# Patient Record
Sex: Female | Born: 1985 | Race: White | Hispanic: No | Marital: Married | Smoking: Never smoker
Health system: Southern US, Community
[De-identification: ages and names within clinical notes are randomized; demographics above are authoritative.]

## PROBLEM LIST (undated history)

## (undated) DIAGNOSIS — Z789 Other specified health status: Secondary | ICD-10-CM

## (undated) DIAGNOSIS — B009 Herpesviral infection, unspecified: Secondary | ICD-10-CM

## (undated) DIAGNOSIS — I493 Ventricular premature depolarization: Secondary | ICD-10-CM

## (undated) HISTORY — PX: TONSILLECTOMY: SUR1361

## (undated) HISTORY — PX: EYE SURGERY: SHX253

## (undated) HISTORY — PX: APPENDECTOMY: SHX54

## (undated) HISTORY — DX: Ventricular premature depolarization: I49.3

---
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
## 2008-06-06 ENCOUNTER — Inpatient Hospital Stay (HOSPITAL_COMMUNITY): Admission: AD | Admit: 2008-06-06 | Discharge: 2008-06-06 | Payer: Self-pay | Admitting: Family Medicine

## 2008-12-07 ENCOUNTER — Ambulatory Visit (HOSPITAL_COMMUNITY): Admission: EM | Admit: 2008-12-07 | Discharge: 2008-12-08 | Payer: Self-pay | Admitting: Emergency Medicine

## 2008-12-08 ENCOUNTER — Encounter (INDEPENDENT_AMBULATORY_CARE_PROVIDER_SITE_OTHER): Payer: Self-pay | Admitting: General Surgery

## 2009-08-11 ENCOUNTER — Inpatient Hospital Stay (HOSPITAL_COMMUNITY): Admission: AD | Admit: 2009-08-11 | Discharge: 2009-08-11 | Payer: Self-pay | Admitting: Obstetrics & Gynecology

## 2010-02-25 ENCOUNTER — Ambulatory Visit (HOSPITAL_COMMUNITY): Admission: RE | Admit: 2010-02-25 | Discharge: 2010-02-25 | Payer: Self-pay | Admitting: Obstetrics

## 2010-02-28 IMAGING — CT CT PELVIS W/ CM
2 of 5 series · 17 of 46 positions shown, 19 images · IV contrast (agent unspecified)
Comparison: None available.

CT ABDOMEN

CLINICAL DATA: Abdominal pain.  Vaginal bleeding.

CT ABDOMEN AND PELVIS WITH CONTRAST
TECHNIQUE: Multidetector CT imaging of the abdomen and pelvis was
performed using the standard protocol following bolus
administration of intravenous contrast.
Contrast: 100 ml Jmnipaque-4HH.

[Series 2: abd_pel 5.0 b40f st · axial · 0.55mm/px · z∈[-752,-417]mm · 14 of 75 slices shown, 16 images]
[im 4/75  soft-tissue]
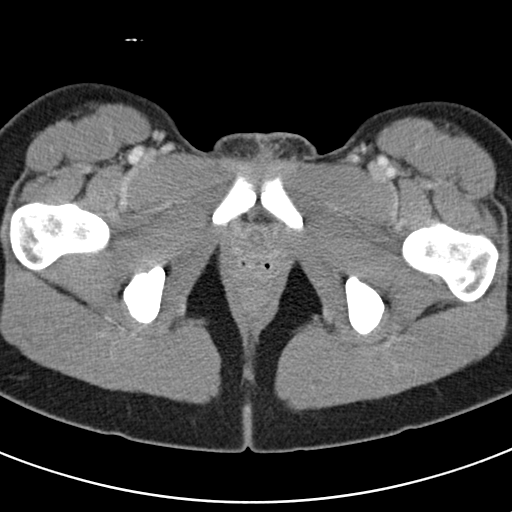
[im 4/75  bone]
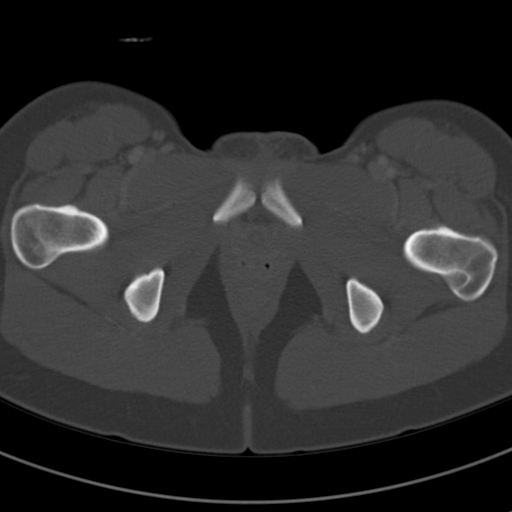
[im 8/75  soft-tissue]
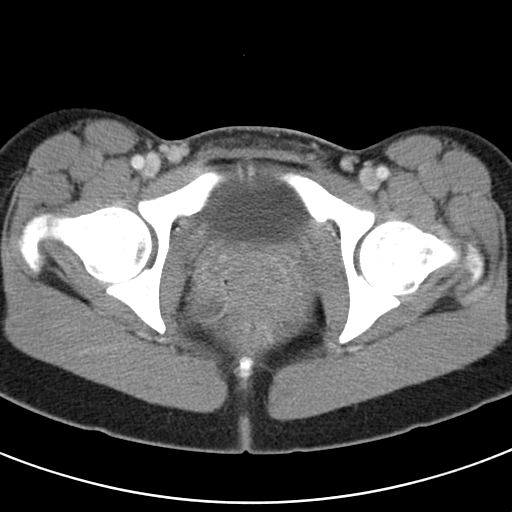
[im 16/75  soft-tissue]
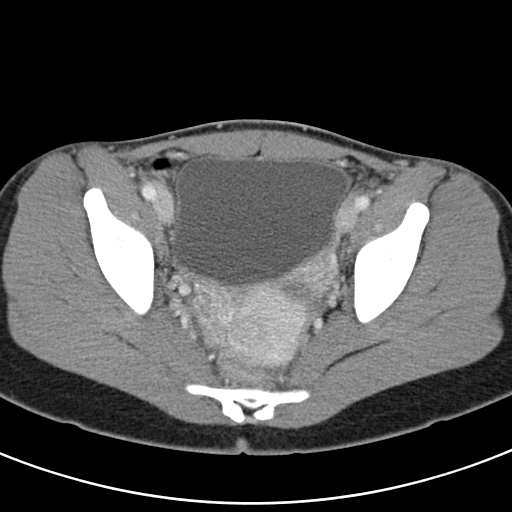
[im 20/75  soft-tissue]
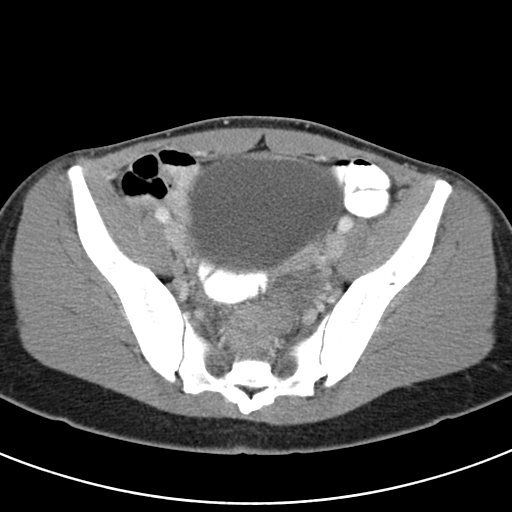
[im 24/75  soft-tissue]
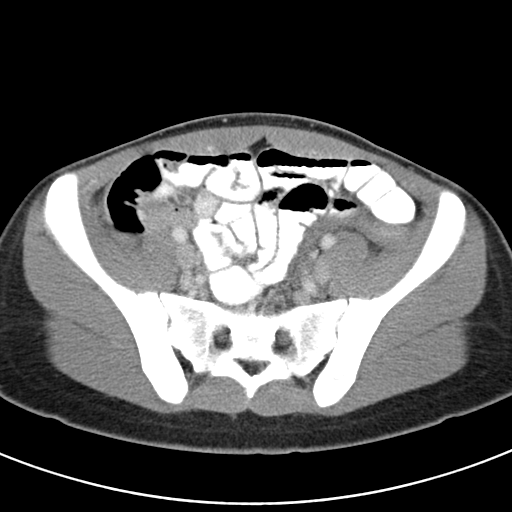
[im 32/75  soft-tissue]
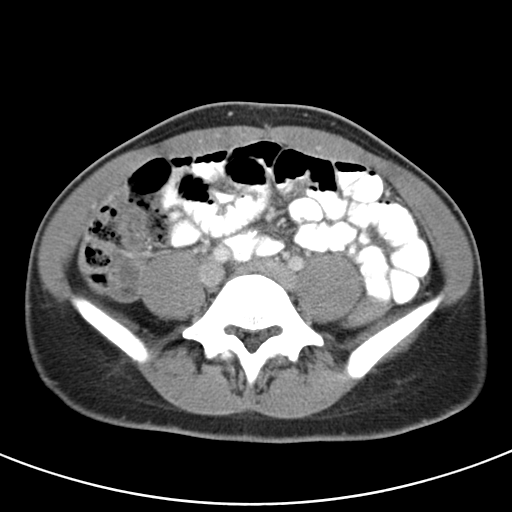
[im 36/75  soft-tissue]
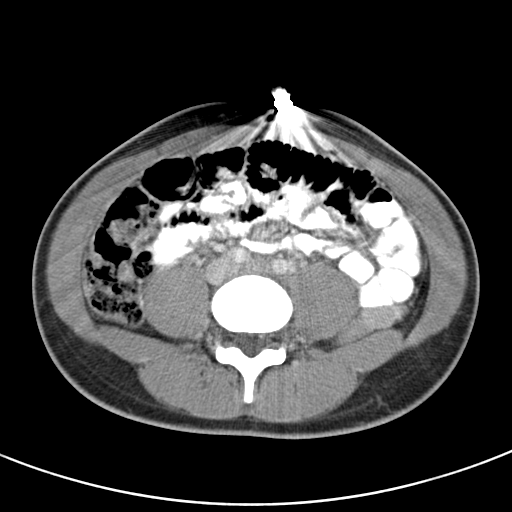
[im 39/75  soft-tissue]
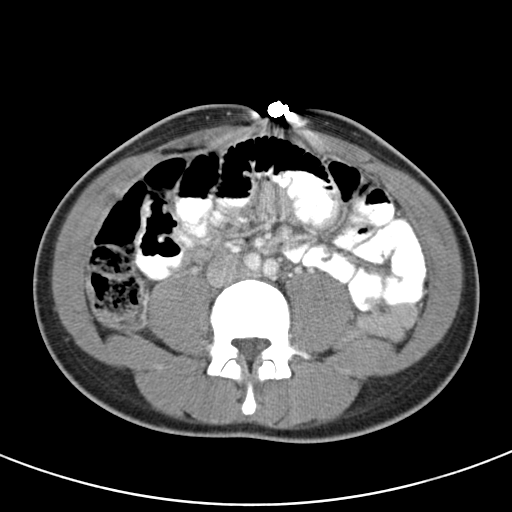
[im 43/75  soft-tissue]
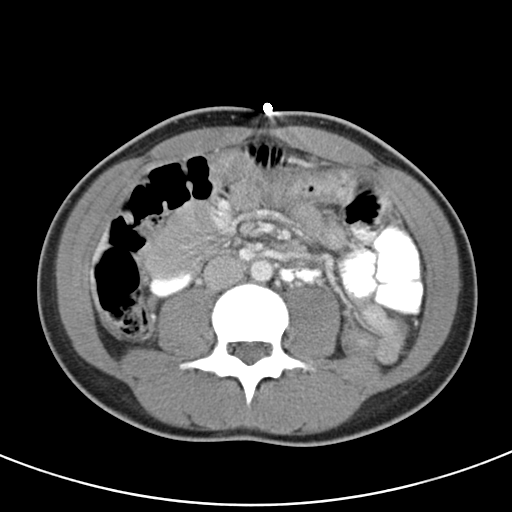
[im 43/75  bone]
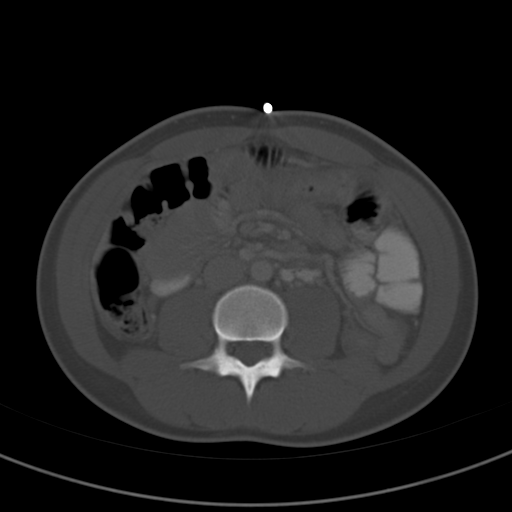
[im 51/75  soft-tissue]
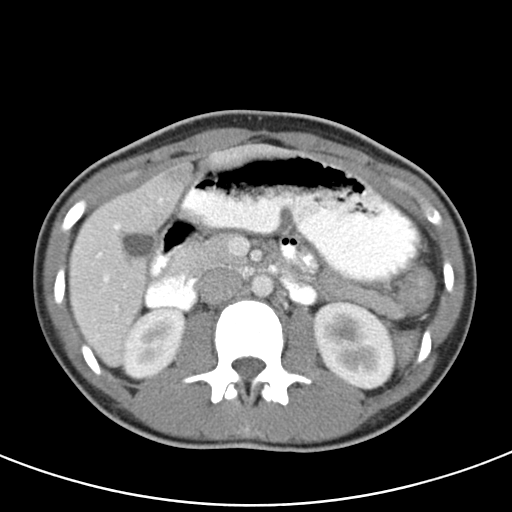
[im 55/75  soft-tissue]
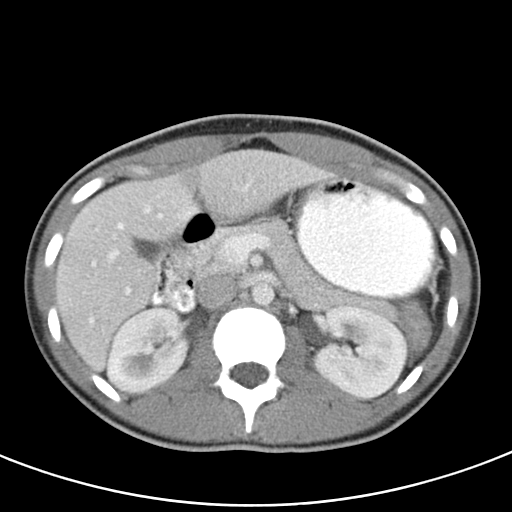
[im 59/75  soft-tissue]
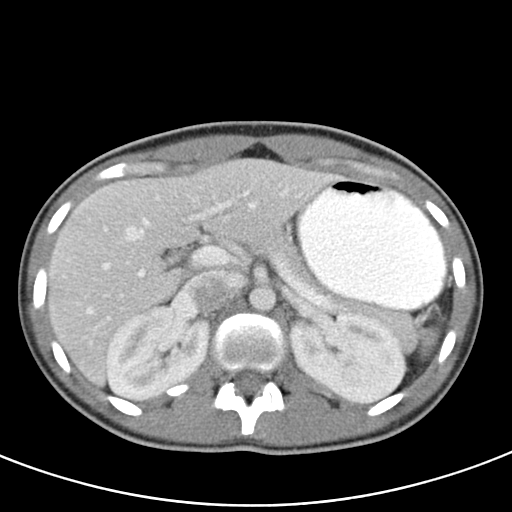
[im 67/75  soft-tissue]
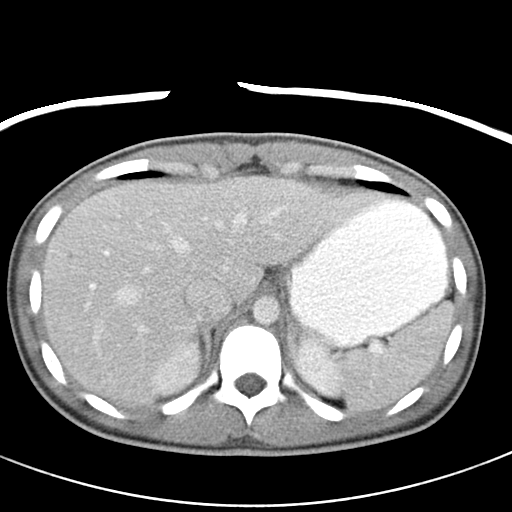
[im 71/75  soft-tissue]
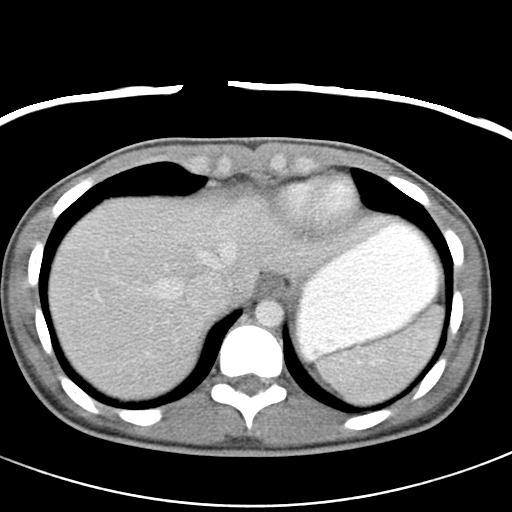

[Series 602: <mpr thick range> · coronal · 0.76mm/px · 3 of 55 slices shown]
[im 19/55  soft-tissue]
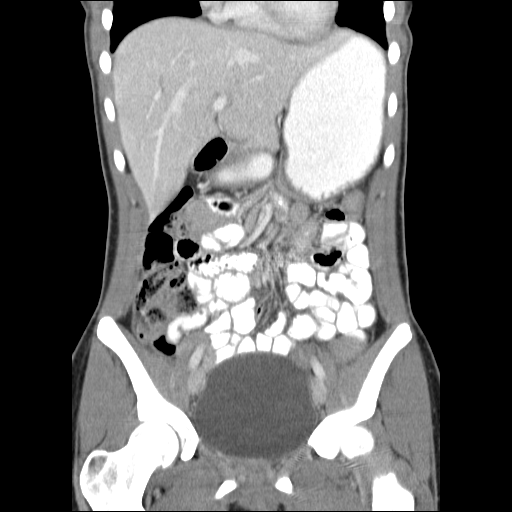
[im 25/55  soft-tissue]
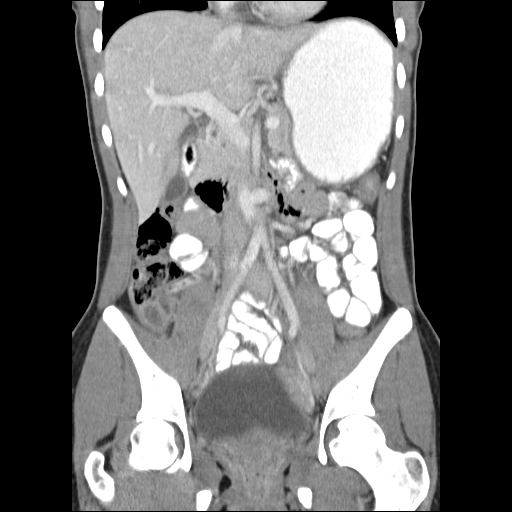
[im 31/55  soft-tissue]
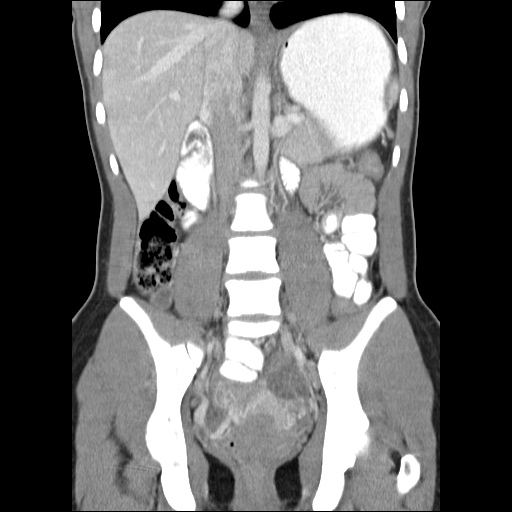

[17 of 46 positions shown; findings below may reference images not displayed]

FINDINGS: The lung bases are clear.  There is no pleural or
pericardial effusion.

The liver, gallbladder, biliary tree, adrenal glands, spleen,
pancreas and kidneys all appear normal.  Stomach and small bowel
appear normal.  No abdominal lymphadenopathy or fluid.  No focal
bony abnormality.
IMPRESSION: Negative abdomen CT scan.

CT PELVIS
FINDINGS: A tiny amount of gas seen within the appendix the tip in
the but there is periappendiceal inflammatory change and a small
amount of periappendiceal fluid.  No abscess or perforation.
Uterus adnexa and urinary bladder unremarkable.  Colon appears
normal.
IMPRESSION: Study is positive for acute appendicitis.  No abscess or
perforation.

## 2010-04-20 ENCOUNTER — Inpatient Hospital Stay (HOSPITAL_COMMUNITY): Admission: AD | Admit: 2010-04-20 | Discharge: 2010-04-20 | Payer: Self-pay | Admitting: Obstetrics & Gynecology

## 2010-04-20 ENCOUNTER — Ambulatory Visit: Payer: Self-pay | Admitting: Obstetrics and Gynecology

## 2010-07-08 ENCOUNTER — Inpatient Hospital Stay (HOSPITAL_COMMUNITY): Admission: AD | Admit: 2010-07-08 | Discharge: 2010-07-08 | Payer: Self-pay | Admitting: Obstetrics

## 2010-07-09 ENCOUNTER — Inpatient Hospital Stay (HOSPITAL_COMMUNITY): Admission: AD | Admit: 2010-07-09 | Discharge: 2010-07-11 | Payer: Self-pay | Admitting: Obstetrics

## 2011-01-24 LAB — CBC
HCT: 32.4 % — ABNORMAL LOW (ref 36.0–46.0)
Hemoglobin: 9.1 g/dL — ABNORMAL LOW (ref 12.0–15.0)
MCH: 22.7 pg — ABNORMAL LOW (ref 26.0–34.0)
MCHC: 31.4 g/dL (ref 30.0–36.0)
MCV: 72.3 fL — ABNORMAL LOW (ref 78.0–100.0)
RBC: 4.48 MIL/uL (ref 3.87–5.11)
WBC: 16.3 10*3/uL — ABNORMAL HIGH (ref 4.0–10.5)
WBC: 20.5 10*3/uL — ABNORMAL HIGH (ref 4.0–10.5)

## 2011-01-24 LAB — RPR: RPR Ser Ql: NONREACTIVE

## 2011-01-27 LAB — URINE MICROSCOPIC-ADD ON

## 2011-01-27 LAB — URINALYSIS, ROUTINE W REFLEX MICROSCOPIC
Bilirubin Urine: NEGATIVE
Glucose, UA: NEGATIVE mg/dL
Ketones, ur: NEGATIVE mg/dL
Nitrite: NEGATIVE
Specific Gravity, Urine: 1.025 (ref 1.005–1.030)
Urobilinogen, UA: 0.2 mg/dL (ref 0.0–1.0)
pH: 6 (ref 5.0–8.0)

## 2011-01-27 LAB — URINE CULTURE

## 2011-02-10 ENCOUNTER — Inpatient Hospital Stay (HOSPITAL_COMMUNITY)
Admission: AD | Admit: 2011-02-10 | Discharge: 2011-02-11 | Disposition: A | Discharge status: Home / Self Care | Payer: Self-pay | Source: Ambulatory Visit | Attending: Obstetrics & Gynecology | Admitting: Obstetrics & Gynecology

## 2011-02-10 DIAGNOSIS — B373 Candidiasis of vulva and vagina: Secondary | ICD-10-CM

## 2011-02-10 DIAGNOSIS — K649 Unspecified hemorrhoids: Secondary | ICD-10-CM | POA: Insufficient documentation

## 2011-02-10 DIAGNOSIS — B3731 Acute candidiasis of vulva and vagina: Secondary | ICD-10-CM | POA: Insufficient documentation

## 2011-02-11 LAB — WET PREP, GENITAL
Trich, Wet Prep: NONE SEEN
Yeast Wet Prep HPF POC: NONE SEEN

## 2011-02-11 LAB — POCT PREGNANCY, URINE: Preg Test, Ur: NEGATIVE

## 2011-02-13 LAB — HERPES SIMPLEX VIRUS CULTURE

## 2011-02-13 LAB — GC/CHLAMYDIA PROBE AMP, GENITAL
Chlamydia, DNA Probe: POSITIVE — AB
GC Probe Amp, Genital: NEGATIVE

## 2011-02-13 LAB — WET PREP, GENITAL
Clue Cells Wet Prep HPF POC: NONE SEEN
Trich, Wet Prep: NONE SEEN
Yeast Wet Prep HPF POC: NONE SEEN

## 2011-02-24 LAB — POCT I-STAT, CHEM 8
Calcium, Ion: 1.22 mmol/L (ref 1.12–1.32)
Glucose, Bld: 92 mg/dL (ref 70–99)
HCT: 35 % — ABNORMAL LOW (ref 36.0–46.0)
Potassium: 3.6 mEq/L (ref 3.5–5.1)

## 2011-02-24 LAB — DIFFERENTIAL
Eosinophils Absolute: 0.3 10*3/uL (ref 0.0–0.7)
Lymphs Abs: 0.9 10*3/uL (ref 0.7–4.0)
Monocytes Absolute: 0.5 10*3/uL (ref 0.1–1.0)
Monocytes Relative: 6 % (ref 3–12)
Neutro Abs: 6.9 10*3/uL (ref 1.7–7.7)
Neutrophils Relative %: 79 % — ABNORMAL HIGH (ref 43–77)

## 2011-02-24 LAB — URINE MICROSCOPIC-ADD ON

## 2011-02-24 LAB — URINALYSIS, ROUTINE W REFLEX MICROSCOPIC
Nitrite: NEGATIVE
pH: 7.5 (ref 5.0–8.0)

## 2011-02-24 LAB — WET PREP, GENITAL
Trich, Wet Prep: NONE SEEN
Yeast Wet Prep HPF POC: NONE SEEN

## 2011-02-24 LAB — RPR: RPR Ser Ql: NONREACTIVE

## 2011-02-24 LAB — CBC
MCV: 88.3 fL (ref 78.0–100.0)
Platelets: 295 10*3/uL (ref 150–400)
RBC: 3.77 MIL/uL — ABNORMAL LOW (ref 3.87–5.11)
RDW: 12.8 % (ref 11.5–15.5)

## 2011-02-24 LAB — GC/CHLAMYDIA PROBE AMP, GENITAL: GC Probe Amp, Genital: NEGATIVE

## 2011-03-25 NOTE — Op Note (Signed)
NAMEKADENCE, MIKKELSON            ACCOUNT NO.:  [id number]   MEDICAL RECORD NO.:  [id number]          PATIENT TYPE:  INP   LOCATION:  0098                         [facility]AC[facility]   PHYSICIAN:  Juanetta Gosling, MDDATE OF BIRTH:  Mar 22, 1986   DATE OF PROCEDURE:  12/07/2008  DATE OF DISCHARGE:                               OPERATIVE REPORT   PREOPERATIVE DIAGNOSIS:  Acute appendicitis.   POSTOPERATIVE DIAGNOSIS:  Acute supparative appendicitis.   PROCEDURE:  Laparoscopic appendectomy.   SURGEON:  Juanetta Gosling, M.D.   ASSISTANT:  None.   ANESTHESIA:  General.   SPECIMENS:  Appendix to pathology.   ESTIMATED BLOOD LOSS:  Minimal.   DRAINS:  None.   COMPLICATIONS:  None.   DISPOSITION:  To recovery room in stable condition.   INDICATIONS:  Kathlyne is a 25 year old female with 48 hours of  progressive right lower quadrant pain and exam consistent with acute  appendicitis as well as a CT scan showing what appears to be acute  appendicitis.  I counseled her for laparoscopic and possible open  appendectomy.   DESCRIPTION OF PROCEDURE:  After informed consent was obtained the  patient was administered 400 mg ciprofloxacin as well as metronidazole.  She was then taken to the operating room.  Sequential compression  devices were placed on her lower extremities prior to induction.  She  was placed under general endotracheal anesthesia without complication.  A Foley catheter was placed.  Her abdomen was then prepped and draped in  standard sterile surgical fashion.  Her arms were tucked and  appropriately padded.  Surgical time-out was then performed.   A 10 mm vertical incision made below her umbilicus due to the presence  of her umbilical ring which had been removed from where there was some  scarring.  Dissection was carried out down to level of the fascia which  was incised sharply.  The peritoneum was entered bluntly.  A 0 Vicryl  pursestring suture was placed.   Then a Hasson trocar was inserted and  the abdomen was insufflated to 15 mmHg pressure.  A further 5 mm port  was placed in her left lower quadrant and a further 5 mm port was placed  in the right upper quadrant under direct vision without complication  after infiltration with local anesthetic.  The appendix was identified  and was noted to be acutely suppurative.  This was then lifted up.  The  base was identified.  The Harmonic scalpel was used to divide the  appendiceal mesentery and then an Endo-GIA stapler with the blue load  was then inserted.  The appendix was divided at its base.  The appendix  was then placed in an EndoCatch bag and removed from the abdomen and  passed off the table as specimen.  The area was irrigated.  The staple  line was hemostatic.  The umbilical trocar was then removed.  This site  was tied down and noted to be airtight without any evidence of any  further hernia.  All trocars were then removed.  The abdomen was then  desufflated.  I then closed all  the wounds with a 4-0 Monocryl in a  subcuticular fashion.  Dermabond was placed over these wounds.  She was  extubated in the operating room having tolerated the procedure well.      Juanetta Gosling, MD  Electronically Signed     MCW/MEDQ  D:  12/08/2008  T:  12/08/2008  Job:  [id number]

## 2011-03-25 NOTE — H&P (Signed)
Brianna Crosby, Brianna Crosby            ACCOUNT NO.:  [id number]   MEDICAL RECORD NO.:  [id number]          PATIENT TYPE:  INP   LOCATION:  0098                         FACILITY:  [facility]   PHYSICIAN:  Juanetta Gosling, MDDATE OF BIRTH:  02-Jul-1986   DATE OF ADMISSION:  12/07/2008  DATE OF DISCHARGE:                              HISTORY & PHYSICAL   CHIEF COMPLAINT:  Right lower quadrant pain.   HISTORY OF PRESENT ILLNESS:  A 25 year old female with about a 48-hour  history of progressive right lower quadrant pain.  It brought her to the  emergency room tonight.  She has no nausea or vomiting.  She has some  diarrhea during this.  She denies any fevers.  Her last menstrual period  was completed yesterday, it was only significant for this was longer  than her normal.   PAST MEDICAL HISTORY:  Negative.   PAST SURGICAL HISTORY:  Tonsillectomy.   She is a nonsmoker and does not drink.  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   She is allergic to PENICILLIN.   She takes no medications.   PHYSICAL EXAMINATION:  She is afebrile.  The rest of her vital signs are  within normal limits.  GENERAL:  She is a thin female in no distress.  NECK:  Supple without adenopathy.  HEART:  Regular rate and rhythm.  LUNGS:  Clear bilaterally.  PELVIC:  Normal per the emergency room.  ABDOMEN:  Soft.  Bowel sounds are present.  Nondistended.  She is tender  to palpation in her right lower quadrant over McBurney's point.   LABS:  BUN and creatinine of 15 and 0.9.  White blood cell count is 8.7,  hematocrit 33.3, and platelets 295,000.  Her HCG is negative.  Her UA is  negative.  Radiology, CT of her abdomen and pelvis shows some  periappendiceal fluid and inflammation, likely indicative of acute  appendicitis.   IMPRESSION:  Acute appendicitis.   PLAN OF ACTION:  Admit, n.p.o., IV fluids, and we will plan on a  laparoscopic or possible open appendectomy.      Juanetta Gosling, MD  Electronically Signed     MCW/MEDQ  D:  12/08/2008  T:  12/08/2008  Job:  [id number]

## 2011-05-07 ENCOUNTER — Emergency Department (HOSPITAL_COMMUNITY): Payer: Self-pay

## 2011-05-07 ENCOUNTER — Emergency Department (HOSPITAL_COMMUNITY)
Admission: EM | Admit: 2011-05-07 | Discharge: 2011-05-08 | Disposition: A | Discharge status: Home / Self Care | Payer: Self-pay | Attending: Emergency Medicine | Admitting: Emergency Medicine

## 2011-05-07 DIAGNOSIS — M25519 Pain in unspecified shoulder: Secondary | ICD-10-CM | POA: Insufficient documentation

## 2011-05-07 DIAGNOSIS — R079 Chest pain, unspecified: Secondary | ICD-10-CM | POA: Insufficient documentation

## 2011-05-07 DIAGNOSIS — M542 Cervicalgia: Secondary | ICD-10-CM | POA: Insufficient documentation

## 2011-05-11 ENCOUNTER — Emergency Department (HOSPITAL_COMMUNITY): Payer: Self-pay

## 2011-05-11 ENCOUNTER — Emergency Department (HOSPITAL_COMMUNITY)
Admission: EM | Admit: 2011-05-11 | Discharge: 2011-05-12 | Disposition: A | Discharge status: Home / Self Care | Payer: Self-pay | Attending: Emergency Medicine | Admitting: Emergency Medicine

## 2011-05-11 DIAGNOSIS — R748 Abnormal levels of other serum enzymes: Secondary | ICD-10-CM | POA: Insufficient documentation

## 2011-05-11 DIAGNOSIS — R109 Unspecified abdominal pain: Secondary | ICD-10-CM | POA: Insufficient documentation

## 2011-05-11 LAB — COMPREHENSIVE METABOLIC PANEL
AST: 50 U/L — ABNORMAL HIGH (ref 0–37)
Albumin: 3.6 g/dL (ref 3.5–5.2)
BUN: 10 mg/dL (ref 6–23)
Chloride: 104 mEq/L (ref 96–112)
Creatinine, Ser: 0.84 mg/dL (ref 0.50–1.10)
Total Bilirubin: 0.2 mg/dL — ABNORMAL LOW (ref 0.3–1.2)
Total Protein: 7.3 g/dL (ref 6.0–8.3)

## 2011-05-11 LAB — URINALYSIS, ROUTINE W REFLEX MICROSCOPIC
Glucose, UA: NEGATIVE mg/dL
Nitrite: NEGATIVE
pH: 6.5 (ref 5.0–8.0)

## 2011-05-11 LAB — DIFFERENTIAL
Basophils Absolute: 0 10*3/uL (ref 0.0–0.1)
Eosinophils Absolute: 0.4 10*3/uL (ref 0.0–0.7)
Eosinophils Relative: 5 % (ref 0–5)
Lymphs Abs: 2.3 10*3/uL (ref 0.7–4.0)
Monocytes Absolute: 0.7 10*3/uL (ref 0.1–1.0)

## 2011-05-11 LAB — CBC
MCHC: 34.5 g/dL (ref 30.0–36.0)
MCV: 81.8 fL (ref 78.0–100.0)
Platelets: 281 10*3/uL (ref 150–400)
RDW: 14.9 % (ref 11.5–15.5)
WBC: 8.1 10*3/uL (ref 4.0–10.5)

## 2011-05-11 LAB — URINE MICROSCOPIC-ADD ON

## 2011-05-11 LAB — POCT PREGNANCY, URINE: Preg Test, Ur: NEGATIVE

## 2011-05-11 LAB — LIPASE, BLOOD: Lipase: 12 U/L (ref 11–59)

## 2011-08-08 LAB — URINE MICROSCOPIC-ADD ON

## 2011-08-08 LAB — URINALYSIS, ROUTINE W REFLEX MICROSCOPIC
Glucose, UA: NEGATIVE
Nitrite: NEGATIVE
Specific Gravity, Urine: 1.015
pH: 7

## 2012-07-25 ENCOUNTER — Emergency Department (INDEPENDENT_AMBULATORY_CARE_PROVIDER_SITE_OTHER)
Admission: EM | Admit: 2012-07-25 | Discharge: 2012-07-25 | Disposition: A | Discharge status: Home / Self Care | Payer: Self-pay | Source: Home / Self Care | Attending: Family Medicine | Admitting: Family Medicine

## 2012-07-25 ENCOUNTER — Encounter (HOSPITAL_COMMUNITY): Payer: Self-pay | Admitting: Emergency Medicine

## 2012-07-25 DIAGNOSIS — N72 Inflammatory disease of cervix uteri: Secondary | ICD-10-CM

## 2012-07-25 HISTORY — DX: Herpesviral infection, unspecified: B00.9

## 2012-07-25 LAB — POCT URINALYSIS DIP (DEVICE)
Bilirubin Urine: NEGATIVE
Nitrite: NEGATIVE
Protein, ur: NEGATIVE mg/dL
pH: 7 (ref 5.0–8.0)

## 2012-07-25 LAB — WET PREP, GENITAL: Yeast Wet Prep HPF POC: NONE SEEN

## 2012-07-25 MED ORDER — AZITHROMYCIN 250 MG PO TABS
1000.0000 mg | ORAL_TABLET | Freq: Once | ORAL | Status: AC
  Administered 2012-07-25: 1000 mg via ORAL

## 2012-07-25 MED ORDER — METRONIDAZOLE 500 MG PO TABS: 500.0000 mg | ORAL_TABLET | Freq: Two times a day (BID) | ORAL | Status: AC

## 2012-07-25 MED ORDER — LIDOCAINE HCL (PF) 1 % IJ SOLN
INTRAMUSCULAR | Status: AC
  Filled 2012-07-25: qty 5

## 2012-07-25 MED ORDER — AZITHROMYCIN 250 MG PO TABS
ORAL_TABLET | ORAL | Status: AC
  Filled 2012-07-25: qty 4

## 2012-07-25 MED ORDER — CEFTRIAXONE SODIUM 250 MG IJ SOLR
INTRAMUSCULAR | Status: AC
  Filled 2012-07-25: qty 250

## 2012-07-25 MED ORDER — CEFTRIAXONE SODIUM 250 MG IJ SOLR
250.0000 mg | Freq: Once | INTRAMUSCULAR | Status: AC
  Administered 2012-07-25: 250 mg via INTRAMUSCULAR

## 2012-07-25 NOTE — ED Notes (Signed)
Pt comes it to get checked for STD... She is asymptomatic but states her boyfriend called her today and states he is being treated for STD and he was having a yellow d/c... Also, pt is concerned about IUD and wants to know if its in place.

## 2012-07-25 NOTE — ED Provider Notes (Signed)
History     CSN: [id number]  Arrival date & time 07/25/12  1427   First MD Initiated Contact with Patient 07/25/12 1502      Chief Complaint  Patient presents with  . Exposure to STD    (Consider location/radiation/quality/duration/timing/severity/associated sxs/prior treatment) HPI Comments: 26 year old female with history of genital herpes. G2 P1 011 here complaining of vaginal discharge with addor on and off for the last few weeks. She has a Myrena IUD for contraception. States her boyfriend [place] is in [place] called her today stating he's been treated for a sexually transmitted disease and was having a yellow urethral discharge. Patient denies current pelvic pain. No vaginal bleeding. No fever or chills. Will also like to check if her IUD is in place. No dysuria or hematuria.    Past Medical History  Diagnosis Date  . Herpes     Past Surgical History  Procedure Date  . Tonsillectomy   . Appendectomy   . Eye surgery     No family history on file.  History  Substance Use Topics  . Smoking status: Never Smoker   . Smokeless tobacco: Not on file  . Alcohol Use: No    OB History    Grav Para Term Preterm Abortions TAB SAB Ect Mult Living                  Review of Systems  Constitutional: Negative for fever and chills.  Gastrointestinal: Negative for nausea, vomiting, abdominal pain and diarrhea.  Genitourinary: Positive for vaginal discharge. Negative for dysuria, frequency, hematuria, flank pain, vaginal bleeding, genital sores and pelvic pain.  Skin: Negative for rash.  Neurological: Negative for dizziness and headaches.  All other systems reviewed and are negative.    Allergies  Penicillins  Home Medications   Current Outpatient Rx  Name Route Sig Dispense Refill  . METRONIDAZOLE 500 MG PO TABS Oral Take 1 tablet (500 mg total) by mouth 2 (two) times daily. 14 tablet 0    BP 121/73  Pulse 78  Temp 99 F (37.2 C) (Oral)  Resp 17  SpO2 100%   LMP 07/13/2012  Physical Exam  Nursing note and vitals reviewed. Constitutional: She is oriented to person, place, and time. She appears well-developed and well-nourished. No distress.  HENT:  Head: Normocephalic and atraumatic.  Eyes: Conjunctivae normal are normal. No scleral icterus.  Neck: Neck supple.  Cardiovascular: Normal heart sounds.   Pulmonary/Chest: Breath sounds normal.  Abdominal: Soft. She exhibits no mass. There is no tenderness.  Genitourinary: Uterus normal. There is no rash or lesion on the right labia. There is no rash or lesion on the left labia. Uterus is not tender. Cervix exhibits discharge. Cervix exhibits no motion tenderness and no friability. Right adnexum displays no mass, no tenderness and no fullness. Left adnexum displays no mass, no tenderness and no fullness. There is erythema around the vagina. No bleeding around the vagina. Vaginal discharge found.    Lymphadenopathy:    She has no cervical adenopathy.       Right: No inguinal adenopathy present.       Left: No inguinal adenopathy present.  Neurological: She is alert and oriented to person, place, and time.  Skin: No rash noted.    ED Course  Procedures (including critical care time)  Labs Reviewed  POCT URINALYSIS DIP (DEVICE) - Abnormal; Notable for the following:    Hgb urine dipstick SMALL (*)     Leukocytes, UA SMALL (*)  Biochemical Testing Only. Please order routine urinalysis from main lab if confirmatory testing is needed.   All other components within normal limits  WET PREP, GENITAL - Abnormal; Notable for the following:    Clue Cells Wet Prep HPF POC MODERATE (*)     WBC, Wet Prep HPF POC MODERATE (*)     All other components within normal limits  GC/CHLAMYDIA PROBE AMP, GENITAL - Abnormal; Notable for the following:    GC Probe Amp, Genital POSITIVE (*)     Chlamydia, DNA Probe POSITIVE (*)     All other components within normal limits  POCT PREGNANCY, URINE  POCT PREGNANCY,  URINE   No results found.   1. Cervicitis       MDM  Patient with mild cervicitis and endocervical discharge. Boyfriend treated for urethritis today in [place]. Treated with Rocephin 250 mg IM x1 and a azithromycin 1 g oral today. GC/Chlamydia and wet prep pending at the time of discharge. Patient was also treated empirically for BV with metronidazole.        Sharin Grave, MD 07/27/12 2251

## 2012-07-27 LAB — GC/CHLAMYDIA PROBE AMP, GENITAL
Chlamydia, DNA Probe: POSITIVE — AB
GC Probe Amp, Genital: POSITIVE — AB

## 2012-07-29 ENCOUNTER — Telehealth (HOSPITAL_COMMUNITY): Payer: Self-pay | Admitting: *Deleted

## 2012-07-29 NOTE — ED Notes (Signed)
GC/Chlamyida pos., Wet prep: mod. clue cells, mod. WBC's.  Pt. adequately treated with Rocephin, Zithromax and Flagyl.  I called pt. and left a message to call.  DHHS forms completed and faxed to [facility]. Vassie Moselle 07/29/2012

## 2012-07-30 ENCOUNTER — Telehealth (HOSPITAL_COMMUNITY): Payer: Self-pay | Admitting: *Deleted

## 2012-07-30 ENCOUNTER — Encounter (HOSPITAL_COMMUNITY): Payer: Self-pay | Admitting: *Deleted

## 2012-07-30 ENCOUNTER — Inpatient Hospital Stay (HOSPITAL_COMMUNITY)
Admission: AD | Admit: 2012-07-30 | Discharge: 2012-07-30 | Disposition: A | Discharge status: Home / Self Care | Payer: Self-pay | Source: Ambulatory Visit | Attending: Obstetrics & Gynecology | Admitting: Obstetrics & Gynecology

## 2012-07-30 DIAGNOSIS — Z202 Contact with and (suspected) exposure to infections with a predominantly sexual mode of transmission: Secondary | ICD-10-CM | POA: Insufficient documentation

## 2012-07-30 DIAGNOSIS — Z2089 Contact with and (suspected) exposure to other communicable diseases: Secondary | ICD-10-CM

## 2012-07-30 LAB — URINALYSIS, ROUTINE W REFLEX MICROSCOPIC
Bilirubin Urine: NEGATIVE
Nitrite: NEGATIVE
Protein, ur: NEGATIVE mg/dL
Specific Gravity, Urine: 1.025 (ref 1.005–1.030)
Urobilinogen, UA: 0.2 mg/dL (ref 0.0–1.0)

## 2012-07-30 LAB — URINE MICROSCOPIC-ADD ON

## 2012-07-30 MED ORDER — AZITHROMYCIN 1 G PO PACK
1.0000 g | PACK | Freq: Once | ORAL | Status: AC
  Administered 2012-07-30: 1 g via ORAL
  Filled 2012-07-30: qty 1

## 2012-07-30 MED ORDER — CEFTRIAXONE SODIUM 250 MG IJ SOLR
250.0000 mg | Freq: Once | INTRAMUSCULAR | Status: AC
  Administered 2012-07-30: 250 mg via INTRAMUSCULAR
  Filled 2012-07-30: qty 250

## 2012-07-30 MED ORDER — METRONIDAZOLE 500 MG PO TABS
2000.0000 mg | ORAL_TABLET | Freq: Once | ORAL | Status: AC
  Administered 2012-07-30: 2000 mg via ORAL
  Filled 2012-07-30: qty 4

## 2012-07-30 NOTE — MAU Provider Note (Signed)
History     CSN: [id number]  Arrival date and time: 07/30/12 2027   First Provider Initiated Contact with Patient 07/30/12 2137      Chief Complaint  Patient presents with  . std testing    HPI This is a 26 y.o. female who presents with complaints of exposure to Gonorrhea and Chlamydia.   She was seen 5 days ago and had the same exposure to the same person.  She was tested and treated for [facility] and Chlamydia at that time. Testing came back + for both.  She then had intercourse with the same person 2 days after treatment, but he had not yet been tested or treated. He went the next day and got testing and was + for both.  She is back tonight for retesting/retreatment, asking if her meds from 5 days ago "are still working".  Has not seen Dr Clearance Coots since 2011 after son was born due to no insurance. States Dr Clearance Coots told her she needs to "be checked for the IUD every month".  Since she does not have insurance she goes to the ED for the IUD to be "checked".  OB History    Grav Para Term Preterm Abortions TAB SAB Ect Mult Living   2 1 1  0 1 0 1 0 0 1      Past Medical History  Diagnosis Date  . Herpes     Past Surgical History  Procedure Date  . Tonsillectomy   . Appendectomy   . Eye surgery     History reviewed. No pertinent family history.  History  Substance Use Topics  . Smoking status: Never Smoker   . Smokeless tobacco: Not on file  . Alcohol Use: No    Allergies:  Allergies  Allergen Reactions  . Penicillins Hives    Prescriptions prior to admission  Medication Sig Dispense Refill  . levonorgestrel (MIRENA) 20 MCG/24HR IUD 1 each by Intrauterine route once.      . metroNIDAZOLE (FLAGYL) 500 MG tablet Take 1 tablet (500 mg total) by mouth 2 (two) times daily.  14 tablet  0    ROS Denies pain or bleeding.  Physical Exam   Blood pressure 122/80, pulse 99, temperature 97.6 F (36.4 C), temperature source Oral, resp. rate 18, last menstrual period  07/13/2012.  Physical Exam  Constitutional: She is oriented to person, place, and time. She appears well-developed and well-nourished. No distress.  Cardiovascular: Normal rate.   Respiratory: Effort normal.  GI: Soft. There is no tenderness.  Musculoskeletal: Normal range of motion.  Neurological: She is alert and oriented to person, place, and time.  Skin: Skin is warm and dry.  Psychiatric: She has a normal mood and affect.  Pelvic exam deferred since it was done 5 days ago and she has had known exposure.  MAU Course  Procedures  MDM Will re-treat for GC and Chlamydia and add Flagyl. Extensive education regarding STD transmission and risk of contracting other more serious StDs was discussed. States HIV and Hep testing were negative, but did not know she needed retesting at 6 mos.   Assessment and Plan  A:  Known STD exposure      Recent GC and Chlamydia infection  P:  Treated with Rocephin, Zithromax and Flagyl       Will not retest due to known exposure       Advised to [facility] and Hepatitis testing in 6 mos.    Wynelle Bourgeois 07/30/2012, 10:07  PM  

## 2012-07-30 NOTE — ED Notes (Signed)
Pt. called in for her lab results. States she did not get my message from yesterday. Pt. verified x 2 and given results.  Pt. told she was adequately treated with Rocephin and Zithromax. Pt. instructed to notify her partner, no sex for 1 week and to practice safe sex. Pt. told she can get HIV testing at the [facility]. STD clinic.

## 2012-07-30 NOTE — MAU Note (Signed)
Pt states that her boyfriend was tested for gonoerrhea and tested positive and she wants to be tested to make sure she doesn't have any STD's

## 2014-01-27 ENCOUNTER — Ambulatory Visit: Payer: Self-pay | Admitting: Advanced Practice Midwife

## 2014-02-07 ENCOUNTER — Ambulatory Visit: Payer: Self-pay | Admitting: Advanced Practice Midwife

## 2014-06-17 ENCOUNTER — Encounter (HOSPITAL_COMMUNITY): Payer: Self-pay | Admitting: Emergency Medicine

## 2014-06-17 ENCOUNTER — Emergency Department (HOSPITAL_COMMUNITY)
Admission: EM | Admit: 2014-06-17 | Discharge: 2014-06-17 | Disposition: A | Discharge status: Home / Self Care | Payer: No Typology Code available for payment source | Attending: Emergency Medicine | Admitting: Emergency Medicine

## 2014-06-17 DIAGNOSIS — S46909A Unspecified injury of unspecified muscle, fascia and tendon at shoulder and upper arm level, unspecified arm, initial encounter: Secondary | ICD-10-CM | POA: Insufficient documentation

## 2014-06-17 DIAGNOSIS — S4980XA Other specified injuries of shoulder and upper arm, unspecified arm, initial encounter: Secondary | ICD-10-CM | POA: Insufficient documentation

## 2014-06-17 DIAGNOSIS — Y9389 Activity, other specified: Secondary | ICD-10-CM | POA: Insufficient documentation

## 2014-06-17 DIAGNOSIS — M25511 Pain in right shoulder: Secondary | ICD-10-CM

## 2014-06-17 DIAGNOSIS — Y9289 Other specified places as the place of occurrence of the external cause: Secondary | ICD-10-CM | POA: Insufficient documentation

## 2014-06-17 DIAGNOSIS — Z8619 Personal history of other infectious and parasitic diseases: Secondary | ICD-10-CM | POA: Insufficient documentation
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
## 2014-06-17 NOTE — ED Notes (Signed)
Declined W/C at D/C and was escorted to lobby by RN. 

## 2014-06-18 NOTE — ED Provider Notes (Signed)
Medical screening examination/treatment/procedure(s) were performed by non-physician practitioner and as supervising physician I was immediately available for consultation/collaboration.   EKG Interpretation None        Purvis SheffieldForrest Corlis Angelica, MD 06/18/14 1236

## 2014-06-24 ENCOUNTER — Emergency Department (HOSPITAL_COMMUNITY): Payer: Medicaid Other

## 2014-06-24 ENCOUNTER — Encounter (HOSPITAL_COMMUNITY): Payer: Self-pay | Admitting: Emergency Medicine

## 2014-06-24 ENCOUNTER — Emergency Department (HOSPITAL_COMMUNITY)
Admission: EM | Admit: 2014-06-24 | Discharge: 2014-06-24 | Disposition: A | Discharge status: Home / Self Care | Payer: Medicaid Other | Attending: Emergency Medicine | Admitting: Emergency Medicine

## 2014-06-24 DIAGNOSIS — Z8619 Personal history of other infectious and parasitic diseases: Secondary | ICD-10-CM | POA: Diagnosis not present

## 2014-06-24 DIAGNOSIS — Z880 Allergy status to penicillin: Secondary | ICD-10-CM | POA: Diagnosis not present

## 2014-06-24 DIAGNOSIS — G8911 Acute pain due to trauma: Secondary | ICD-10-CM | POA: Diagnosis not present

## 2014-06-24 DIAGNOSIS — M25519 Pain in unspecified shoulder: Secondary | ICD-10-CM | POA: Diagnosis present

## 2014-06-24 DIAGNOSIS — M25511 Pain in right shoulder: Secondary | ICD-10-CM

## 2014-06-24 MED ORDER — NAPROXEN 500 MG PO TABS
500.0000 mg | ORAL_TABLET | Freq: Two times a day (BID) | ORAL | Status: DC
Start: 2014-06-24 — End: 2016-06-02
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
## 2014-06-26 NOTE — ED Provider Notes (Signed)
Medical screening examination/treatment/procedure(s) were performed by non-physician practitioner and as supervising physician I was immediately available for consultation/collaboration.   EKG Interpretation None        Al Gagen, DO 06/26/14 1551 

## 2014-09-11 ENCOUNTER — Encounter (HOSPITAL_COMMUNITY): Payer: Self-pay | Admitting: Emergency Medicine

## 2015-03-01 ENCOUNTER — Ambulatory Visit: Payer: Medicaid Other | Admitting: Certified Nurse Midwife

## 2015-10-10 ENCOUNTER — Other Ambulatory Visit: Payer: Self-pay | Admitting: Otolaryngology

## 2015-10-10 DIAGNOSIS — H9312 Tinnitus, left ear: Secondary | ICD-10-CM

## 2015-11-11 NOTE — L&D Delivery Note (Signed)
Delivery Note Patient pushed well for < 10 minutes.  At 12:36 PM a viable female was delivered via Vaginal, Spontaneous Delivery (Presentation: Occiput Anterior  ).  APGAR: 8, 9; weight  .   Placenta status: Spontaneous and in tact  Cord:  3 vessels, with the following complications: None.  Cord pH: n/a  Anesthesia:   Episiotomy: None Lacerations: None Suture Repair: n/a Est. Blood Loss (mL): 300  Mom to postpartum.  Baby to Couplet care / Skin to Skin.  Brianna Crosby GEFFEL Tate Jerkins 06/02/2016, 1:11 PM

## 2015-12-26 ENCOUNTER — Other Ambulatory Visit: Payer: Self-pay | Admitting: Obstetrics and Gynecology

## 2015-12-26 LAB — OB RESULTS CONSOLE RPR: RPR: NONREACTIVE

## 2015-12-26 LAB — OB RESULTS CONSOLE RUBELLA ANTIBODY, IGM: Rubella: IMMUNE

## 2015-12-26 LAB — OB RESULTS CONSOLE HIV ANTIBODY (ROUTINE TESTING): HIV: NONREACTIVE

## 2015-12-26 LAB — OB RESULTS CONSOLE GC/CHLAMYDIA
Chlamydia: NEGATIVE
Gonorrhea: NEGATIVE

## 2015-12-26 LAB — OB RESULTS CONSOLE ABO/RH: RH TYPE: POSITIVE

## 2015-12-26 LAB — OB RESULTS CONSOLE HEPATITIS B SURFACE ANTIGEN: HEP B S AG: NEGATIVE

## 2015-12-26 LAB — OB RESULTS CONSOLE ANTIBODY SCREEN: Antibody Screen: NEGATIVE

## 2016-04-16 LAB — OB RESULTS CONSOLE GBS: GBS: NEGATIVE

## 2016-05-05 LAB — OB RESULTS CONSOLE HEPATITIS B SURFACE ANTIGEN: HEP B S AG: NEGATIVE

## 2016-05-05 LAB — OB RESULTS CONSOLE ANTIBODY SCREEN: ANTIBODY SCREEN: NEGATIVE

## 2016-05-05 LAB — OB RESULTS CONSOLE ABO/RH: RH Type: POSITIVE

## 2016-05-05 LAB — OB RESULTS CONSOLE GC/CHLAMYDIA
CHLAMYDIA, DNA PROBE: NEGATIVE
Gonorrhea: NEGATIVE

## 2016-05-05 LAB — OB RESULTS CONSOLE RPR: RPR: NONREACTIVE

## 2016-05-05 LAB — OB RESULTS CONSOLE HIV ANTIBODY (ROUTINE TESTING): HIV: NONREACTIVE

## 2016-05-05 LAB — OB RESULTS CONSOLE RUBELLA ANTIBODY, IGM: RUBELLA: IMMUNE

## 2016-05-09 ENCOUNTER — Other Ambulatory Visit: Payer: Self-pay | Admitting: Obstetrics

## 2016-05-31 ENCOUNTER — Inpatient Hospital Stay (HOSPITAL_COMMUNITY)
Admission: AD | Admit: 2016-05-31 | Discharge: 2016-05-31 | Disposition: A | Discharge status: Home / Self Care | Payer: 59 | Source: Ambulatory Visit | Attending: Obstetrics and Gynecology | Admitting: Obstetrics and Gynecology

## 2016-05-31 ENCOUNTER — Encounter (HOSPITAL_COMMUNITY): Payer: Self-pay | Admitting: *Deleted

## 2016-05-31 NOTE — MAU Note (Signed)
Contractions since . Denies LOF or bleeding. 3cm last sve

## 2016-06-01 ENCOUNTER — Encounter (HOSPITAL_COMMUNITY): Payer: Self-pay

## 2016-06-01 ENCOUNTER — Inpatient Hospital Stay (HOSPITAL_COMMUNITY)
Admission: AD | Admit: 2016-06-01 | Discharge: 2016-06-03 | DRG: 775 | Disposition: A | Discharge status: Home / Self Care | Payer: 59 | Source: Ambulatory Visit | Attending: Obstetrics and Gynecology | Admitting: Obstetrics and Gynecology

## 2016-06-01 DIAGNOSIS — Z9889 Other specified postprocedural states: Secondary | ICD-10-CM

## 2016-06-01 DIAGNOSIS — Z3A39 39 weeks gestation of pregnancy: Secondary | ICD-10-CM | POA: Diagnosis not present

## 2016-06-01 DIAGNOSIS — Z3490 Encounter for supervision of normal pregnancy, unspecified, unspecified trimester: Secondary | ICD-10-CM

## 2016-06-01 DIAGNOSIS — Z9049 Acquired absence of other specified parts of digestive tract: Secondary | ICD-10-CM

## 2016-06-01 DIAGNOSIS — Z8249 Family history of ischemic heart disease and other diseases of the circulatory system: Secondary | ICD-10-CM

## 2016-06-01 HISTORY — DX: Other specified health status: Z78.9

## 2016-06-01 LAB — CBC
HCT: 32.8 % — ABNORMAL LOW (ref 36.0–46.0)
HEMOGLOBIN: 10.9 g/dL — AB (ref 12.0–15.0)
MCH: 25.4 pg — ABNORMAL LOW (ref 26.0–34.0)
MCHC: 33.2 g/dL (ref 30.0–36.0)
MCV: 76.5 fL — ABNORMAL LOW (ref 78.0–100.0)
Platelets: 200 10*3/uL (ref 150–400)
RBC: 4.29 MIL/uL (ref 3.87–5.11)
RDW: 18 % — ABNORMAL HIGH (ref 11.5–15.5)
WBC: 10.1 10*3/uL (ref 4.0–10.5)

## 2016-06-01 LAB — TYPE AND SCREEN
ABO/RH(D): A POS
ANTIBODY SCREEN: NEGATIVE

## 2016-06-01 MED ORDER — OXYTOCIN 40 UNITS IN LACTATED RINGERS INFUSION - SIMPLE MED: 2.5000 [IU]/h | INTRAVENOUS | Status: DC

## 2016-06-01 MED ORDER — BUTORPHANOL TARTRATE 1 MG/ML IJ SOLN: 1.0000 mg | INTRAMUSCULAR | Status: DC | PRN

## 2016-06-01 MED ORDER — EPHEDRINE 5 MG/ML INJ
10.0000 mg | INTRAVENOUS | Status: DC | PRN
  Filled 2016-06-01 (×2): qty 4

## 2016-06-01 MED ORDER — LACTATED RINGERS IV SOLN
500.0000 mL | Freq: Once | INTRAVENOUS | Status: AC
  Administered 2016-06-02: 500 mL via INTRAVENOUS

## 2016-06-01 MED ORDER — SOD CITRATE-CITRIC ACID 500-334 MG/5ML PO SOLN: 30.0000 mL | ORAL | Status: DC | PRN

## 2016-06-01 MED ORDER — ONDANSETRON HCL 4 MG/2ML IJ SOLN: 4.0000 mg | Freq: Four times a day (QID) | INTRAMUSCULAR | Status: DC | PRN

## 2016-06-01 MED ORDER — PHENYLEPHRINE 40 MCG/ML (10ML) SYRINGE FOR IV PUSH (FOR BLOOD PRESSURE SUPPORT)
80.0000 ug | PREFILLED_SYRINGE | INTRAVENOUS | Status: DC | PRN
  Filled 2016-06-01: qty 5
  Filled 2016-06-01: qty 10

## 2016-06-01 MED ORDER — FENTANYL 2.5 MCG/ML BUPIVACAINE 1/10 % EPIDURAL INFUSION (WH - ANES)
14.0000 mL/h | INTRAMUSCULAR | Status: DC | PRN
  Administered 2016-06-02: 12 mL/h via EPIDURAL
  Filled 2016-06-01: qty 125

## 2016-06-01 MED ORDER — LACTATED RINGERS IV SOLN: 500.0000 mL | INTRAVENOUS | Status: DC | PRN

## 2016-06-01 MED ORDER — OXYTOCIN BOLUS FROM INFUSION
500.0000 mL | Freq: Once | INTRAVENOUS | Status: AC
  Administered 2016-06-02: 500 mL via INTRAVENOUS

## 2016-06-01 MED ORDER — OXYCODONE-ACETAMINOPHEN 5-325 MG PO TABS: 1.0000 | ORAL_TABLET | ORAL | Status: DC | PRN

## 2016-06-01 MED ORDER — DIPHENHYDRAMINE HCL 50 MG/ML IJ SOLN: 12.5000 mg | INTRAMUSCULAR | Status: DC | PRN

## 2016-06-01 MED ORDER — PHENYLEPHRINE 40 MCG/ML (10ML) SYRINGE FOR IV PUSH (FOR BLOOD PRESSURE SUPPORT)
80.0000 ug | PREFILLED_SYRINGE | INTRAVENOUS | Status: DC | PRN
  Filled 2016-06-01: qty 5

## 2016-06-01 MED ORDER — OXYCODONE-ACETAMINOPHEN 5-325 MG PO TABS: 2.0000 | ORAL_TABLET | ORAL | Status: DC | PRN

## 2016-06-01 MED ORDER — LACTATED RINGERS IV SOLN
INTRAVENOUS | Status: DC
  Administered 2016-06-01 – 2016-06-02 (×2): via INTRAVENOUS

## 2016-06-01 MED ORDER — ACETAMINOPHEN 325 MG PO TABS: 650.0000 mg | ORAL_TABLET | ORAL | Status: DC | PRN

## 2016-06-01 MED ORDER — LIDOCAINE HCL (PF) 1 % IJ SOLN
30.0000 mL | INTRAMUSCULAR | Status: DC | PRN
  Filled 2016-06-01: qty 30

## 2016-06-01 NOTE — MAU Note (Signed)
Contractions since 6pm every 2-3 mins. Denies LOF or vag bleeding. +FM. Was 3cm 2 days ago

## 2016-06-02 ENCOUNTER — Encounter (HOSPITAL_COMMUNITY): Payer: Self-pay

## 2016-06-02 ENCOUNTER — Inpatient Hospital Stay (HOSPITAL_COMMUNITY): Payer: 59 | Admitting: Anesthesiology

## 2016-06-02 LAB — ABO/RH: ABO/RH(D): A POS

## 2016-06-02 LAB — RPR: RPR: NONREACTIVE

## 2016-06-02 MED ORDER — WITCH HAZEL-GLYCERIN EX PADS: 1.0000 "application " | MEDICATED_PAD | CUTANEOUS | Status: DC | PRN

## 2016-06-02 MED ORDER — COCONUT OIL OIL
1.0000 | TOPICAL_OIL | Status: DC | PRN
Start: 2016-06-02 — End: 2016-06-03
  Administered 2016-06-03: 1 via TOPICAL
  Filled 2016-06-02: qty 120

## 2016-06-02 MED ORDER — ONDANSETRON HCL 4 MG PO TABS: 4.0000 mg | ORAL_TABLET | ORAL | Status: DC | PRN

## 2016-06-02 MED ORDER — ONDANSETRON HCL 4 MG/2ML IJ SOLN: 4.0000 mg | INTRAMUSCULAR | Status: DC | PRN

## 2016-06-02 MED ORDER — SENNOSIDES-DOCUSATE SODIUM 8.6-50 MG PO TABS
2.0000 | ORAL_TABLET | ORAL | Status: DC
  Administered 2016-06-02: 2 via ORAL
  Filled 2016-06-02: qty 2

## 2016-06-02 MED ORDER — LIDOCAINE HCL (PF) 1 % IJ SOLN
INTRAMUSCULAR | Status: DC | PRN
  Administered 2016-06-02: 4 mL via EPIDURAL
  Administered 2016-06-02: 3 mL via EPIDURAL

## 2016-06-02 MED ORDER — OXYTOCIN 40 UNITS IN LACTATED RINGERS INFUSION - SIMPLE MED
1.0000 m[IU]/min | INTRAVENOUS | Status: DC
  Administered 2016-06-02: 6 m[IU]/min via INTRAVENOUS
  Administered 2016-06-02: 8 m[IU]/min via INTRAVENOUS
  Administered 2016-06-02: 5 m[IU]/min via INTRAVENOUS
  Administered 2016-06-02: 1 m[IU]/min via INTRAVENOUS
  Administered 2016-06-02: 7 m[IU]/min via INTRAVENOUS
  Filled 2016-06-02: qty 1000

## 2016-06-02 MED ORDER — DIPHENHYDRAMINE HCL 25 MG PO CAPS: 25.0000 mg | ORAL_CAPSULE | Freq: Four times a day (QID) | ORAL | Status: DC | PRN

## 2016-06-02 MED ORDER — SIMETHICONE 80 MG PO CHEW: 80.0000 mg | CHEWABLE_TABLET | ORAL | Status: DC | PRN

## 2016-06-02 MED ORDER — DIBUCAINE 1 % RE OINT: 1.0000 "application " | TOPICAL_OINTMENT | RECTAL | Status: DC | PRN

## 2016-06-02 MED ORDER — BENZOCAINE-MENTHOL 20-0.5 % EX AERO
1.0000 | INHALATION_SPRAY | CUTANEOUS | Status: DC | PRN
Start: 2016-06-02 — End: 2016-06-03

## 2016-06-02 MED ORDER — ACETAMINOPHEN 325 MG PO TABS: 650.0000 mg | ORAL_TABLET | ORAL | Status: DC | PRN

## 2016-06-02 MED ORDER — PRENATAL MULTIVITAMIN CH
1.0000 | ORAL_TABLET | Freq: Every day | ORAL | Status: DC
  Administered 2016-06-03: 1 via ORAL
  Filled 2016-06-02: qty 1

## 2016-06-02 MED ORDER — ZOLPIDEM TARTRATE 5 MG PO TABS: 5.0000 mg | ORAL_TABLET | Freq: Every evening | ORAL | Status: DC | PRN

## 2016-06-02 MED ORDER — IBUPROFEN 600 MG PO TABS
600.0000 mg | ORAL_TABLET | Freq: Four times a day (QID) | ORAL | Status: DC
  Administered 2016-06-02 – 2016-06-03 (×4): 600 mg via ORAL
  Filled 2016-06-02 (×4): qty 1

## 2016-06-02 MED ORDER — TETANUS-DIPHTH-ACELL PERTUSSIS 5-2.5-18.5 LF-MCG/0.5 IM SUSP: 0.5000 mL | Freq: Once | INTRAMUSCULAR | Status: DC

## 2016-06-02 NOTE — Anesthesia Procedure Notes (Signed)
Epidural Patient location during procedure: OB Start time: 06/02/2016 7:51 AM  Staffing Anesthesiologist: Mal Amabile Performed: anesthesiologist   Preanesthetic Checklist Completed: patient identified, site marked, surgical consent, pre-op evaluation, timeout performed, IV checked, risks and benefits discussed and monitors and equipment checked  Epidural Patient position: sitting Prep: site prepped and draped and DuraPrep Patient monitoring: continuous pulse ox and blood pressure Approach: midline Location: L4-L5 Injection technique: LOR air  Needle:  Needle type: Tuohy  Needle gauge: 17 G Needle length: 9 cm and 9 Needle insertion depth: 4 cm Catheter type: closed end flexible Catheter size: 19 Gauge Catheter at skin depth: 9 cm Test dose: negative and Other  Assessment Events: blood not aspirated, injection not painful, no injection resistance, negative IV test and no paresthesia  Additional Notes Patient identified. Risks and benefits discussed including failed block, incomplete  Pain control, post dural puncture headache, nerve damage, paralysis, blood pressure Changes, nausea, vomiting, reactions to medications-both toxic and allergic and post Partum back pain. All questions were answered. Patient expressed understanding and wished to proceed. Sterile technique was used throughout procedure. Epidural site was Dressed with sterile barrier dressing. No paresthesias, signs of intravascular injection Or signs of intrathecal spread were encountered.  Patient was more comfortable after the epidural was dosed. Please see RN's note for documentation of vital signs and FHR which are stable.

## 2016-06-02 NOTE — Lactation Note (Signed)
This note was copied from a baby's chart. Lactation Consultation Note  Patient Name: Brianna Crosby WYOVZ'C Date: 06/02/2016 Reason for consult: Initial assessment   Initial consult with first time BF mom of 6 hour old infant. Mom was pumping when I went into the room. She was able to pump 1 cc EBM which was fed to infant. Mom initially said she was planning to pump and bottle feed and them said she would like to latch the infant to the breast.   Assisted mom in placing infant to right breast in football hold. Infant latched on a off a few times and then was able to latch and suckle intermittently with a few swallows noted. Mom was pleased infant was able to BF. Infant was still nursing when I went into the room. Enc mom to hand express before feeding and after pumping. Showed mom how to hand express.   Enc mom to BF infant 8-12 x in 24 hours at first feeding cues. Mom wishes to pump, enc her to pump every 2-3 hours post BF with DEBP for 15 minutes on Initiate setting. Showed parents how to set up, assemble, disassemble and operate pump on Initiate setting.   BF Resources Handout and LC Brochure given, informed of BF Support Groups, LC phone # and IP/OP services. Enc parents to call out for nurse when feeding assistance needed. Follow up tomorrow and prn.      Maternal Data Formula Feeding for Exclusion: No Has patient been taught Hand Expression?: Yes Does the patient have breastfeeding experience prior to this delivery?: No  Feeding Feeding Type: Breast Fed Length of feed: 5 min  LATCH Score/Interventions Latch: Repeated attempts needed to sustain latch, nipple held in mouth throughout feeding, stimulation needed to elicit sucking reflex. Intervention(s): Adjust position;Assist with latch;Breast massage;Breast compression  Audible Swallowing: A few with stimulation Intervention(s): Alternate breast massage;Hand expression  Type of Nipple: Everted at rest and after  stimulation  Comfort (Breast/Nipple): Soft / non-tender     Hold (Positioning): Assistance needed to correctly position infant at breast and maintain latch. Intervention(s): Breastfeeding basics reviewed;Support Pillows;Position options;Skin to skin  LATCH Score: 7  Lactation Tools Discussed/Used WIC Program: No Pump Review: Setup, frequency, and cleaning;Milk Storage Initiated by:: Reviewed   Consult Status Consult Status: Follow-up Date: 06/03/16 Follow-up type: In-patient    Brianna Crosby 06/02/2016, 8:47 PM

## 2016-06-02 NOTE — Progress Notes (Signed)
Brianna Crosby is a 30 y.o. G3P1011 at [redacted]w[redacted]d by LMP admitted for active labor  Subjective: Patient comfortable with epidural  Objective: BP (!) 99/51   Pulse 81   Temp 99 F (37.2 C) (Oral)   Resp 17   Ht 5\' 1"  (1.549 m)   Wt 77.1 kg (170 lb)   SpO2 100%   BMI 32.12 kg/m  No intake/output data recorded. No intake/output data recorded.  FHT:  FHR: 135 bpm, variability: moderate,  accelerations:  Present,  decelerations:  Absent UC:   irregular, every 3-6 minutes SVE:   Dilation: 8 Effacement (%): 80 Station: -1 Exam by:: Dr. Mora Appl AROM: bloody  Labs: Lab Results  Component Value Date   WBC 10.1 06/01/2016   HGB 10.9 (L) 06/01/2016   HCT 32.8 (L) 06/01/2016   MCV 76.5 (L) 06/01/2016   PLT 200 06/01/2016    Assessment / Plan: Augmentation of labor, progressing well  Labor: Progressing normally Preeclampsia:  no signs or symptoms of toxicity Fetal Wellbeing:  Category I Pain Control:  Epidural I/D:  n/a Anticipated MOD:  NSVD  Garlene Apperson STACIA 06/02/2016, 9:50 AM

## 2016-06-02 NOTE — Anesthesia Postprocedure Evaluation (Signed)
Anesthesia Post Note  Patient: Brianna Crosby  Procedure(s) Performed: * No procedures listed *  Patient location during evaluation: Mother Baby Anesthesia Type: Epidural Level of consciousness: awake, awake and alert, oriented and patient cooperative Pain management: pain level controlled Vital Signs Assessment: post-procedure vital signs reviewed and stable Respiratory status: spontaneous breathing, nonlabored ventilation and respiratory function stable Cardiovascular status: stable Postop Assessment: no headache, no backache, epidural receding, patient able to bend at knees and no signs of nausea or vomiting Anesthetic complications: no     Last Vitals:  Vitals:   06/02/16 1407 06/02/16 1448  BP: 119/74 106/73  Pulse: 86 83  Resp:  17  Temp:  36.7 C    Last Pain:  Vitals:   06/02/16 1448  TempSrc: Oral  PainSc: 0-No pain   Pain Goal: Patients Stated Pain Goal: 9 (06/01/16 2200)               [name]

## 2016-06-02 NOTE — H&P (Signed)
Brianna Crosby is a 30 y.o. female presenting for labor  29 yo G3P1011 @ 39+0 presents for painful contractions and was found to be 5 cm dilated. She had presented for a labor check the previous night and was 3 cm. On arrival tonight she was 5 cm dilated. Her pregnancy has been uncomplicated OB History    Gravida Para Term Preterm AB Living   3 1 1  0 1 1   SAB TAB Ectopic Multiple Live Births   1 0 0 0       Past Medical History:  Diagnosis Date  . Herpes   . Medical history non-contributory    Past Surgical History:  Procedure Laterality Date  . APPENDECTOMY    . EYE SURGERY    . TONSILLECTOMY     Family History: family history includes Hypertension in her maternal grandfather, maternal grandmother, and mother. Social History:  reports that she has never smoked. She has never used smokeless tobacco. She reports that she does not drink alcohol or use drugs.     Maternal Diabetes: No Genetic Screening: Normal Maternal Ultrasounds/Referrals: Normal Fetal Ultrasounds or other Referrals:  None Maternal Substance Abuse:  No Significant Maternal Medications:  None Significant Maternal Lab Results:  None Other Comments:  None  ROS History Dilation: 5.5 Effacement (%): 70 Station: -2 Exam by:: D Jasso, RN Blood pressure 111/63, pulse 85, temperature 98.3 F (36.8 C), temperature source Oral, resp. rate 18, height 5\' 1"  (1.549 m), weight 77.1 kg (170 lb), SpO2 100 %. Exam Physical Exam  Prenatal labs: ABO, Rh: --/--/A POS (07/23 2130) Antibody: NEG (07/23 2130) Rubella: Immune (06/26 0000) RPR: Nonreactive (06/26 0000)  HBsAg: Negative (06/26 0000)  HIV: Non-reactive (06/26 0000)  GBS: Negative (06/07 0000)   Assessment/Plan: 1) Admit 2) epidural on request 3) Pitocin augmentation prn   Lieutenant Abarca H. 06/02/2016, 3:11 AM

## 2016-06-02 NOTE — Anesthesia Preprocedure Evaluation (Addendum)
Anesthesia Evaluation  Patient identified by MRN, date of birth, ID band Patient awake    Reviewed: Allergy & Precautions, H&P , Patient's Chart, lab work & pertinent test results  Airway Mallampati: III  TM Distance: >3 FB Neck ROM: full    Dental no notable dental hx.    Pulmonary neg pulmonary ROS,    Pulmonary exam normal breath sounds clear to auscultation       Cardiovascular negative cardio ROS Normal cardiovascular exam Rhythm:Regular Rate:Normal     Neuro/Psych negative neurological ROS  negative psych ROS   GI/Hepatic negative GI ROS, Neg liver ROS,   Endo/Other  Obesity  Renal/GU negative Renal ROS     Musculoskeletal   Abdominal   Peds  Hematology  (+) anemia ,   Anesthesia Other Findings   Reproductive/Obstetrics (+) Pregnancy HSV                            Anesthesia Physical Anesthesia Plan  ASA: II  Anesthesia Plan: Epidural   Post-op Pain Management:    Induction:   Airway Management Planned:   Additional Equipment:   Intra-op Plan:   Post-operative Plan:   Informed Consent: I have reviewed the patients History and Physical, chart, labs and discussed the procedure including the risks, benefits and alternatives for the proposed anesthesia with the patient or authorized representative who has indicated his/her understanding and acceptance.     Plan Discussed with: Anesthesiologist  Anesthesia Plan Comments:         Anesthesia Quick Evaluation

## 2016-06-02 NOTE — Anesthesia Pain Management Evaluation Note (Signed)
  [facility] Pain Management Visit Note  Patient: Brianna Crosby, 30 y.o., female  "Hello I am a member of the anesthesia team at [facility]. We have an anesthesia team available at all times to provide care throughout the hospital, including epidural management and anesthesia for C-section. I don't know your plan for the delivery whether it a natural birth, water birth, IV sedation, nitrous supplementation, doula or epidural, but we want to meet your pain goals."   1.Was your pain managed to your expectations on prior hospitalizations?   No prior hospitalizations  2.What is your expectation for pain management during this hospitalization?     Epidural  3.How can we help you reach that goal? epidural  Record the patient's initial score and the patient's pain goal.   Pain: 8  Pain Goal: 8 [facility] wants you to be able to say your pain was always managed very well.  Jireh Elmore 06/02/2016

## 2016-06-03 LAB — CBC
HCT: 28.4 % — ABNORMAL LOW (ref 36.0–46.0)
Hemoglobin: 9.7 g/dL — ABNORMAL LOW (ref 12.0–15.0)
MCH: 25.9 pg — ABNORMAL LOW (ref 26.0–34.0)
MCHC: 34.2 g/dL (ref 30.0–36.0)
MCV: 75.9 fL — ABNORMAL LOW (ref 78.0–100.0)
PLATELETS: 178 10*3/uL (ref 150–400)
RBC: 3.74 MIL/uL — AB (ref 3.87–5.11)
RDW: 18.1 % — ABNORMAL HIGH (ref 11.5–15.5)
WBC: 15.3 10*3/uL — AB (ref 4.0–10.5)

## 2016-06-03 NOTE — Discharge Summary (Signed)
Obstetric Discharge Summary Reason for Admission: onset of labor Prenatal Procedures: ultrasound Intrapartum Procedures: spontaneous vaginal delivery Postpartum Procedures: none Complications-Operative and Postpartum: none Hemoglobin  Date Value Ref Range Status  06/03/2016 9.7 (L) 12.0 - 15.0 g/dL Final   HCT  Date Value Ref Range Status  06/03/2016 28.4 (L) 36.0 - 46.0 % Final    Physical Exam:  General: alert Lochia: appropriate Uterine Fundus: firm   Discharge Diagnoses: Term Pregnancy-delivered  Discharge Information: Date: 06/03/2016 Activity: pelvic rest Diet: routine Medications: PNV and Ibuprofen Condition: stable Instructions: refer to practice specific booklet Discharge to: home Follow-up Information    [facility] GEFFEL CLARK, MD. Schedule an appointment as soon as possible for a visit in 1 month(s).   Specialty:  Obstetrics Contact information: [address] [place] [address] [phone]           Newborn Data: Live born female  Birth Weight: 7 lb 6.2 oz (3350 g) APGAR: 8, 9  Home with mother.  Marston Mccadden E 06/03/2016, 10:02 AM

## 2016-06-03 NOTE — Progress Notes (Signed)
PPD#1 Pt without complaints. Lochia wnl. Would like to go home.  VSSAF IMP/ Stable Plan/ Discharge to home.

## 2016-06-23 NOTE — Patient Instructions (Signed)
Your procedure is scheduled on:  Monday, July 07, 2016  [facility] through [facility] at:  10:30 AM  Pick up the phone at the desk and dial [phone].  Call this number if you have problems the morning of surgery: [phone].  Remember: Do NOT eat food:  After Midnight Sunday, July 06, 2016  Do NOT drink clear liquids after:  8:00 AM day of surgery  Take these medicines the morning of surgery with a SIP OF WATER:  None  Do NOT wear jewelry (body piercing), metal hair clips/bobby pins, make-up, or nail polish. Do NOT wear lotions, powders, or perfumes.  You may wear deodorant. Do NOT shave for 48 hours prior to surgery. Do NOT bring valuables to the hospital. Contacts, dentures, or bridgework may not be worn into surgery.  Have a responsible adult drive you home and stay with you for 24 hours after your procedure

## 2016-06-24 ENCOUNTER — Inpatient Hospital Stay (HOSPITAL_COMMUNITY)
Admission: RE | Admit: 2016-06-24 | Discharge: 2016-06-24 | Disposition: A | Discharge status: Home / Self Care | Payer: 59 | Source: Ambulatory Visit

## 2016-07-07 ENCOUNTER — Encounter (HOSPITAL_COMMUNITY): Admission: RE | Payer: Self-pay | Source: Ambulatory Visit

## 2016-07-07 ENCOUNTER — Ambulatory Visit (HOSPITAL_COMMUNITY): Admission: RE | Admit: 2016-07-07 | Payer: 59 | Source: Ambulatory Visit | Admitting: Obstetrics

## 2016-07-07 SURGERY — LIGATION, FALLOPIAN TUBE, LAPAROSCOPIC
Anesthesia: Choice | Laterality: Bilateral

## 2018-11-17 ENCOUNTER — Other Ambulatory Visit: Payer: Self-pay | Admitting: Family Medicine

## 2018-11-17 DIAGNOSIS — N6310 Unspecified lump in the right breast, unspecified quadrant: Secondary | ICD-10-CM

## 2018-12-04 ENCOUNTER — Encounter: Payer: Self-pay | Admitting: Emergency Medicine

## 2018-12-04 ENCOUNTER — Emergency Department (HOSPITAL_COMMUNITY)
Admission: EM | Admit: 2018-12-04 | Discharge: 2018-12-04 | Disposition: A | Discharge status: Home / Self Care | Payer: POS | Attending: Emergency Medicine | Admitting: Emergency Medicine

## 2018-12-04 ENCOUNTER — Emergency Department (HOSPITAL_COMMUNITY): Payer: POS

## 2018-12-04 DIAGNOSIS — R0600 Dyspnea, unspecified: Secondary | ICD-10-CM

## 2018-12-04 DIAGNOSIS — R079 Chest pain, unspecified: Secondary | ICD-10-CM

## 2018-12-04 DIAGNOSIS — R002 Palpitations: Secondary | ICD-10-CM | POA: Diagnosis not present

## 2018-12-04 DIAGNOSIS — R0602 Shortness of breath: Secondary | ICD-10-CM

## 2018-12-04 DIAGNOSIS — Z79899 Other long term (current) drug therapy: Secondary | ICD-10-CM | POA: Diagnosis not present

## 2018-12-04 LAB — CBC WITH DIFFERENTIAL/PLATELET
ABS IMMATURE GRANULOCYTES: 0.02 10*3/uL (ref 0.00–0.07)
Basophils Absolute: 0.1 10*3/uL (ref 0.0–0.1)
Basophils Relative: 1 %
Eosinophils Absolute: 0.4 10*3/uL (ref 0.0–0.5)
Eosinophils Relative: 7 %
HCT: 35.2 % — ABNORMAL LOW (ref 36.0–46.0)
HEMOGLOBIN: 11.7 g/dL — AB (ref 12.0–15.0)
IMMATURE GRANULOCYTES: 0 %
Lymphocytes Relative: 34 %
Lymphs Abs: 2.1 10*3/uL (ref 0.7–4.0)
MCH: 29.4 pg (ref 26.0–34.0)
MCHC: 33.2 g/dL (ref 30.0–36.0)
MCV: 88.4 fL (ref 80.0–100.0)
MONO ABS: 0.7 10*3/uL (ref 0.1–1.0)
Monocytes Relative: 11 %
NEUTROS ABS: 3 10*3/uL (ref 1.7–7.7)
NEUTROS PCT: 47 %
Platelets: 294 10*3/uL (ref 150–400)
RBC: 3.98 MIL/uL (ref 3.87–5.11)
RDW: 13.2 % (ref 11.5–15.5)
WBC: 6.3 10*3/uL (ref 4.0–10.5)
nRBC: 0 % (ref 0.0–0.2)

## 2018-12-04 LAB — BASIC METABOLIC PANEL
Anion gap: 8 (ref 5–15)
BUN: 9 mg/dL (ref 6–20)
CHLORIDE: 105 mmol/L (ref 98–111)
CO2: 25 mmol/L (ref 22–32)
Calcium: 9.4 mg/dL (ref 8.9–10.3)
Creatinine, Ser: 0.84 mg/dL (ref 0.44–1.00)
GFR calc non Af Amer: >60 mL/min (ref >60)
Glucose, Bld: 98 mg/dL (ref 70–99)
POTASSIUM: 3.7 mmol/L (ref 3.5–5.1)
SODIUM: 138 mmol/L (ref 135–145)

## 2018-12-04 LAB — POC URINE PREG, ED: PREG TEST UR: NEGATIVE

## 2018-12-04 LAB — TSH: TSH: 2.678 u[IU]/mL (ref 0.350–4.500)

## 2018-12-04 NOTE — ED Triage Notes (Signed)
Pt here for eval of intermittnet shortness of breath since 2015. Pt states she is supposed to follow up with cardiology this coming week. States she was short of breath earlier today when folding clothes accompanied with chest pain that always changes location. Pt states she is not short of breath currently and "does not know" if she is currently having any chest pain.

## 2018-12-04 NOTE — ED Provider Notes (Signed)
[facility]   CSN: [id number] Arrival date & time: 12/04/18  1845     History   Chief Complaint Chief Complaint  Patient presents with  . Shortness of Breath    HPI Brianna Crosby is a 33 y.o. female.  HPI  Brianna Crosby is a 33 y.o. female with PMH of herpes who presents with her husband at the bedside with about 2 to 3 minutes of left sided chest discomfort that was sharp and pleuritic.  She felt short of breath at that time.  She reports that she has had this sensation intermittently for many years and it occurs about once every few months.  Last time she felt this was several months ago.  This evening at around 5:40 PM she was sitting on the couch and folding clothes when she had the symptoms started suddenly.  Not associated with lightheadedness or diaphoresis and no nausea or vomiting.  Pain was nonradiating.  No chest pressure.  She had shortness of breath that lasted for about 10 to 20 minutes but her pain lasted only 2 to 3 minutes.  This is characteristic of her prior episodes as well.  No recent falls or trauma.  Has an IUD but does not take systemic hormones.  Does not smoke.  No recent surgery.  No recent immobilization.  No history of DVT or PE.  She feels normal at this time.  States that she was seen by her primary care provider in the past for similar symptoms and EKG was reportedly done showing what she describes as premature beats.  She is scheduled to see cardiology on Tuesday.  Past Medical History:  Diagnosis Date  . Herpes   . Medical history non-contributory     Patient Active Problem List   Diagnosis Date Noted  . Pregnancy 06/01/2016    Past Surgical History:  Procedure Laterality Date  . APPENDECTOMY    . EYE SURGERY    . TONSILLECTOMY       OB History    Gravida  3   Para  2   Term  2   Preterm  0   AB  1   Living  2     SAB  1   TAB  0   Ectopic  0   Multiple  0   Live Births  1            Home Medications    Prior to Admission medications   Medication Sig Start Date End Date Taking? Authorizing Provider  valACYclovir (VALTREX) 500 MG tablet Take 500 mg by mouth 2 (two) times daily.   Yes [provider]    Family History Family History  Problem Relation Age of Onset  . Hypertension Mother   . Hypertension Maternal Grandmother   . Hypertension Maternal Grandfather     Social History Social History   Tobacco Use  . Smoking status: Never Smoker  . Smokeless tobacco: Never Used  Substance Use Topics  . Alcohol use: No  . Drug use: No     Allergies   Penicillins   Review of Systems Review of Systems  Constitutional: Negative for chills and fever.  HENT: Negative for ear pain and sore throat.   Eyes: Negative for pain and visual disturbance.  Respiratory: Positive for shortness of breath. Negative for cough.   Cardiovascular: Positive for chest pain and palpitations.  Gastrointestinal: Negative for abdominal pain and vomiting.  Genitourinary: Negative  for dysuria and hematuria.  Musculoskeletal: Negative for arthralgias and back pain.  Skin: Negative for color change and rash.  Neurological: Negative for seizures and syncope.  All other systems reviewed and are negative.    Physical Exam Updated Vital Signs BP 115/70   Pulse 70   Temp 98.4 F (36.9 C) (Oral)   Resp 15   Ht 5\' 1"  (1.549 m)   Wt 71.7 kg   SpO2 98%   BMI 29.85 kg/m   Physical Exam Vitals signs and nursing note reviewed.  Constitutional:      General: She is not in acute distress.    Appearance: Normal appearance. She is well-developed. She is not ill-appearing or diaphoretic.  HENT:     Head: Normocephalic and atraumatic.  Eyes:     Conjunctiva/sclera: Conjunctivae normal.  Neck:     Musculoskeletal: Neck supple.  Cardiovascular:     Rate and Rhythm: Normal rate. Rhythm irregular.     Heart sounds: S1 normal and S2 normal. No  murmur.  Pulmonary:     Effort: Pulmonary effort is normal. No tachypnea or respiratory distress.     Breath sounds: Normal breath sounds.  Abdominal:     General: Abdomen is flat.     Palpations: Abdomen is soft.     Tenderness: There is no abdominal tenderness. Negative signs include Murphy's sign, Rovsing's sign and McBurney's sign.  Skin:    General: Skin is warm and dry.  Neurological:     General: No focal deficit present.     Mental Status: She is alert and oriented to person, place, and time.     GCS: GCS eye subscore is 4. GCS verbal subscore is 5. GCS motor subscore is 6.     Cranial Nerves: Cranial nerves are intact.  Psychiatric:        Behavior: Behavior is cooperative.      ED Treatments / Results  Labs (all labs ordered are listed, but only abnormal results are displayed) Labs Reviewed  CBC WITH DIFFERENTIAL/PLATELET - Abnormal; Notable for the following components:      Result Value   Hemoglobin 11.7 (*)    HCT 35.2 (*)    All other components within normal limits  BASIC METABOLIC PANEL  TSH  POC URINE PREG, ED    EKG EKG Interpretation  Date/Time:  Saturday December 04 2018 18:58:16 EST Ventricular Rate:  91 PR Interval:    QRS Duration: 87 QT Interval:  467 QTC Calculation: 471 R Axis:   79 Text Interpretation:  Sinus rhythm Multiple premature complexes, vent & supraven Short PR interval Nonspecific T wave abnormality No previous tracing Confirmed by Cathren Laine ([id number]) on 12/04/2018 7:04:12 PM   Radiology Dg Chest Port 1 View  Result Date: 12/04/2018 CLINICAL DATA:  Shortness of breath. Left-sided chest pain. EXAM: PORTABLE CHEST 1 VIEW COMPARISON:  05/07/2011 FINDINGS: The cardiomediastinal contours are normal. The lungs are clear. Pulmonary vasculature is normal. No consolidation, pleural effusion, or pneumothorax. No acute osseous abnormalities are seen. IMPRESSION: Unremarkable portable AP view of the chest. Electronically Signed   By: Narda Rutherford M.D.   On: 12/04/2018 19:59    Procedures Procedures (including critical care time)  Medications Ordered in ED Medications - No data to display   Initial Impression / Assessment and Plan / ED Course  I have reviewed the triage vital signs and the nursing notes.  Pertinent labs & imaging results that were available during my care of the patient were reviewed  by me and considered in my medical decision making (see chart for details).      MDM:  Imaging: Chest x-ray shows no evidence for acute pathology.  ED Provider Interpretation of EKG: What appears to be sinus rhythm with a rate of 91 bpm although there is a sinus arrhythmia.  P waves likely c/w PACs present.  No ST segment elevation or depression.  QTC mildly prolonged at 471.  PR interval from normal sinus beats appears to be normal.  No delta wave.  No evidence for Brugada syndrome.  No LVH.  Labs: CBC, BMP, TSH normal.  On initial evaluation, patient appears well. Afebrile and hemodynamically stable. Alert and oriented x4, pleasant, and cooperative.  Patient presents with chest pain and dyspnea as detailed above.  On exam, there is no focal abnormality. No history of shingles, no rash, and low suspicion for Zoster at this time. No tenderness to palpation and no recent falls or trauma. EKG shows no evidence for acute ischemia.  She reports the symptoms occur mostly while she was at rest and sitting down and she has never experienced chest pain or dyspnea on exertion or when she is upset.  No risk factors for CAD and her story is not consistent with angina.  Very low suspicion for ACS and do not feel the troponin is indicated.  Based on EKG and hx I have low suspicion for pericarditis. No evidence for myocarditis. CXR without evidence for pneumonia, pneumothorax, or acute bony abnormality. No fever or leukocytosis and low suspicion for acute infectious etiology. No back pain, pulse discrepancy, or neuro deficit. Doubt aortic  dissection.   No history of CHF. BNP not indicated. No orthopnea, PND, and no LE edema. Low suspicion for CHF exacerbation. PERC negative and very low clinical suspicion for PE.   Electrolytes normal.  Blood counts normal.  No evidence for anemia.  Thyroid studies normal as above patient asymptomatic in the ED.  Suspect that her times are secondary to what appears to be sinus rhythm with PACs.  She has no family history of sudden or other wise unexplained death.  No history of cardiac arrhythmia or cardiac death in the family.  This was most likely what she reports her PCP identified previously as well.  No evidence for life-threatening etiology and she is appropriate to follow-up as an outpatient with her cardiologist on Tuesday.  The plan for this patient was discussed with Dr. Denton LankSteinl who voiced agreement and who oversaw evaluation and treatment of this patient.   The patient was fully informed and involved with the history taking, evaluation, workup including labs/images, and plan. The patient's concerns and questions were addressed to the patient's satisfaction and she expressed agreement with the plan to DC home.    Final Clinical Impressions(s) / ED Diagnoses   Final diagnoses:  Shortness of breath  Palpitations    ED Discharge Orders    None       Issiac Jamar, Sherryle LisJames F II, MD 12/05/18 Mariann [id number]    Cathren LaineSteinl, Kevin, MD 12/07/18 1212

## 2018-12-06 DIAGNOSIS — I491 Atrial premature depolarization: Secondary | ICD-10-CM | POA: Insufficient documentation

## 2018-12-15 ENCOUNTER — Ambulatory Visit: Payer: POS | Admitting: Cardiology

## 2018-12-15 NOTE — Progress Notes (Deleted)
Subjective:   @Patient  ID@: Brianna Crosby, female    DOB: 24-Apr-1986, 33 y.o.   MRN: [id number]  Farris Has, MD:  No chief complaint on file.   HPI   Patient with no past medical history referred to [place] for evaluation of PAC's by Dr. Kateri Plummer.  She was treated for pneumonia in December and was there to see PCP for follow up that she states is resolved; however, was noted to have irregular heart beat on exam. She did go to ER on Sat for episode of shortness of breath that lasted 2-3 mins. She suddenly noticed left sided sharp chest pain and felt that she couldnt breathe. Workup was negative except for PAC's on EKG. She was advised to keep her follow up with [place]. She reports that since 2011, she has had palpitations described as flutters. Also has episodic dizziness for several years. No sycnope.  No family history of heart disease. No tobacco use. No history of hypertension, hyperlipidemia, thyroid disorders. No anemia. Patient is married with 2 young children. No history of blood clots. She has normal menstrual cycles. She currently has IUD in place.  Past Medical History:  Diagnosis Date  . Herpes   . Medical history non-contributory     Past Surgical History:  Procedure Laterality Date  . APPENDECTOMY    . EYE SURGERY    . TONSILLECTOMY      Family History  Problem Relation Age of Onset  . Hypertension Mother   . Hypertension Maternal Grandmother   . Hypertension Maternal Grandfather     Social History   Socioeconomic History  . Marital status: Married    Spouse name: Not on file  . Number of children: Not on file  . Years of education: Not on file  . Highest education level: Not on file  Occupational History  . Not on file  Social Needs  . Financial resource strain: Not on file  . Food insecurity:    Worry: Not on file    Inability: Not on file  . Transportation needs:    Medical: Not on file    Non-medical: Not on file  Tobacco Use  . Smoking  status: Never Smoker  . Smokeless tobacco: Never Used  Substance and Sexual Activity  . Alcohol use: No  . Drug use: No  . Sexual activity: Yes  Lifestyle  . Physical activity:    Days per week: Not on file    Minutes per session: Not on file  . Stress: Not on file  Relationships  . Social connections:    Talks on phone: Not on file    Gets together: Not on file    Attends religious service: Not on file    Active member of club or organization: Not on file    Attends meetings of clubs or organizations: Not on file    Relationship status: Not on file  . Intimate partner violence:    Fear of current or ex partner: Not on file    Emotionally abused: Not on file    Physically abused: Not on file    Forced sexual activity: Not on file  Other Topics Concern  . Not on file  Social History Narrative  . Not on file    Current Outpatient Medications on File Prior to Visit  Medication Sig Dispense Refill  . valACYclovir (VALTREX) 500 MG tablet Take 500 mg by mouth 2 (two) times daily.     No current facility-administered  medications on file prior to visit.      ROS     Objective:     Echocardiogram 12/08/2018: Left ventricle cavity is normal in size. Normal left ventricular shape. Normal global wall motion. Diastolic function assessment limited due to frequent PAC's, but probably normal. Calculated EF 58%. Mild to moderate mitral regurgitation. IVC is dilated with respiratory variation. This may suggest elevated right heart pressure.   Physical Exam        Assessment & Recommendations:   There are no diagnoses linked to this encounter.     Altamese Newtok, FNP-C [facility] Office: [phone] Fax: [phone]

## 2018-12-17 ENCOUNTER — Ambulatory Visit: Payer: Medicaid Other | Admitting: Cardiology

## 2018-12-21 ENCOUNTER — Encounter: Payer: Self-pay | Admitting: Cardiology

## 2018-12-21 ENCOUNTER — Ambulatory Visit (INDEPENDENT_AMBULATORY_CARE_PROVIDER_SITE_OTHER): Payer: POS | Admitting: Cardiology

## 2018-12-21 VITALS — BP 128/67 | HR 54 | Ht 61.0 in | Wt 161.9 lb

## 2018-12-21 DIAGNOSIS — R0781 Pleurodynia: Secondary | ICD-10-CM

## 2018-12-21 DIAGNOSIS — I491 Atrial premature depolarization: Secondary | ICD-10-CM

## 2018-12-21 NOTE — Progress Notes (Signed)
Subjective:   @Patient  ID@: Brianna Crosby, female    DOB: August 12, 1986, 33 y.o.   MRN: [id number]  Farris Has, MD:  Chief Complaint  Patient presents with  . Palpitations    f/u echo results     HPI: Brianna Crosby  is a 33 y.o. female  with Palpitations.    Patient was last seen by [place] 2 weeks ago to establish care for frequent PACs noted on EKG.  She has had chronic palpitations for many years and symptoms have remained unchanged.  She was seen by [place] also for episode of shortness of breath and presumably pleuritic chest pain.  Symptoms have since improved with using heat to her chest.  She underwent echocardiogram for further evaluation of frequent PACs and now presents to discuss results.  No new complaints today. She continues to have palpitations with occasional heart racing. She reports not necessarily being bothered by this as they have been happening for so long that she does not pay any attention to them now. She exercises on occasion and does notice palpitations doing this and also feels fatigued after exercising.  No family history of heart disease. No tobacco use. No history of hypertension, hyperlipidemia, thyroid disorders. No anemia. Patient is married with 2 young children. No history of blood clots. She has normal menstrual cycles. She currently has IUD in place.    Past Medical History:  Diagnosis Date  . Herpes   . Medical history non-contributory     Past Surgical History:  Procedure Laterality Date  . APPENDECTOMY    . EYE SURGERY    . TONSILLECTOMY      Family History  Problem Relation Age of Onset  . Hypertension Mother   . Hypertension Maternal Grandmother   . Hypertension Maternal Grandfather     Social History   Socioeconomic History  . Marital status: Married    Spouse name: Not on file  . Number of children: Not on file  . Years of education: Not on file  . Highest education level: Not on file  Occupational History  .  Not on file  Social Needs  . Financial resource strain: Not on file  . Food insecurity:    Worry: Not on file    Inability: Not on file  . Transportation needs:    Medical: Not on file    Non-medical: Not on file  Tobacco Use  . Smoking status: Never Smoker  . Smokeless tobacco: Never Used  Substance and Sexual Activity  . Alcohol use: No  . Drug use: No  . Sexual activity: Yes  Lifestyle  . Physical activity:    Days per week: Not on file    Minutes per session: Not on file  . Stress: Not on file  Relationships  . Social connections:    Talks on phone: Not on file    Gets together: Not on file    Attends religious service: Not on file    Active member of club or organization: Not on file    Attends meetings of clubs or organizations: Not on file    Relationship status: Not on file  . Intimate partner violence:    Fear of current or ex partner: Not on file    Emotionally abused: Not on file    Physically abused: Not on file    Forced sexual activity: Not on file  Other Topics Concern  . Not on file  Social History Narrative  .  Not on file    Current Outpatient Medications on File Prior to Visit  Medication Sig Dispense Refill  . Multiple Vitamins-Minerals (MULTIVITAL PO) Take 1 tablet by mouth daily.     No current facility-administered medications on file prior to visit.      Review of Systems  Constitution: Positive for malaise/fatigue (after exercise). Negative for decreased appetite, weight gain and weight loss.  Eyes: Negative for visual disturbance.  Cardiovascular: Positive for palpitations (unchanged). Negative for chest pain (improved with heat), claudication, dyspnea on exertion and leg swelling.  Respiratory: Negative for hemoptysis, shortness of breath and wheezing.   Endocrine: Negative for cold intolerance and heat intolerance.  Skin: Negative for nail changes.  Musculoskeletal: Negative for myalgias.  Gastrointestinal: Negative for abdominal  pain, nausea and vomiting.  Genitourinary: Negative for menorrhagia.  Neurological: Negative for difficulty with concentration, dizziness, focal weakness and headaches.  Psychiatric/Behavioral: Negative for altered mental status and suicidal ideas.  All other systems reviewed and are negative.      Objective:    Today's Vitals   12/21/18 1007  BP: 128/67  Pulse: (!) 54  SpO2: 99%  Weight: 161 lb 14.4 oz (73.4 kg)  Height: 5\' 1"  (1.549 m)   Body mass index is 30.59 kg/m.   Echocardiogram 12/08/2018: Left ventricle cavity is normal in size. Normal left ventricular shape. Normal global wall motion. Diastolic function assessment limited due to frequent PAC's, but probably normal. Calculated EF 58%. Mild to moderate mitral regurgitation. IVC is dilated with respiratory variation. This may suggest elevated right heart pressure.    Physical Exam  Constitutional: She is oriented to person, place, and time. Vital signs are normal. She appears well-developed and well-nourished.  HENT:  Head: Normocephalic and atraumatic.  Neck: Normal range of motion.  Cardiovascular: Normal rate, normal heart sounds and intact distal pulses. An irregular rhythm present. Frequent extrasystoles are present.  Pulmonary/Chest: Effort normal and breath sounds normal. No accessory muscle usage. No respiratory distress.  Abdominal: Soft. Bowel sounds are normal.  Musculoskeletal: Normal range of motion.  Neurological: She is alert and oriented to person, place, and time.  Skin: Skin is warm and dry.  Vitals reviewed.          Assessment & Recommendations:   1. PAC (premature atrial contraction) She continues to have frequent PAC's on exam and was also noted on echocardiogram. Echocardiogram; otherwise, essentially normal. Suspect her PAC's are non cardiac etiology. I will place her on 48 hour holter monitor to evaluate her PAC burden. Would like to avoid using medications in view of her young age  if possible. I have recommended conservative measures with B6 and B12 vitamins. Encouraged continued exercise.   2. Pleuritic chest pain Suspect musculoskeletal etiology. I have recommended that she continue with heat and ice to her chest as needed.   I will see her back in 3-4 weeks for follow up on her PAC's.    Altamese Camas, FNP-C [facility] [place] Office: [phone] Fax: [phone]

## 2018-12-24 DIAGNOSIS — I491 Atrial premature depolarization: Secondary | ICD-10-CM | POA: Diagnosis not present

## 2018-12-29 ENCOUNTER — Telehealth: Payer: Self-pay | Admitting: Cardiology

## 2018-12-29 NOTE — Telephone Encounter (Signed)
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  Symptomatic transmissions reveals correlation with occasional PACs and PVCs, multiple episodes of atrial bigeminy and ventricular bigeminy where not reported as symptomatic.  No atrial fibrillation, no VT.  Please let patient know I will discuss this on her OV.  Parents of this infant using pacifier. Parents informed that pacifier may mask feeding cues; may lead to difficulty attaching to breast;  may lead to decreased milk supply for mother; and increased likelihood of engorgement for mother. Parents advised that it is best practice for a pacifier to be introduced at 54-71 weeks of age after breastfeeding is well-established.

## 2019-01-03 NOTE — Telephone Encounter (Signed)
Left vm

## 2019-01-06 ENCOUNTER — Ambulatory Visit: Payer: Self-pay | Admitting: Cardiology

## 2019-01-20 NOTE — Progress Notes (Signed)
Subjective:   @Patient  ID@: Brianna Crosby, female    DOB: 09-13-1986, 33 y.o.   MRN: [id number]  Farris Has, MD:  Chief Complaint  Patient presents with  . premature atrial contraction    4 week F/U    HPI: Brianna Crosby  is a 33 y.o. female  with frequent PAC's.  Patient has frequent PAC's that have been chronic for several years. Underwent 48 hour holter monitor revealing 13.2% PVC burden and 9.8% PAC burden. She has had chronic palpitations for many years and mostly does not even notice them now. Echocardiogram was normal with LVEF of 58%. She now presents to discuss holter results.  She also has episodes of sharp, left-sided chest pain that makes it difficult to breathe that is presumed to be pleuritic chest pain. She has had improvement in symptoms with using heat to her chest.   No new complaints today.   No family history of heart disease. No tobacco use. No history of hypertension, hyperlipidemia, thyroid disorders. No anemia. Patient is married with 2 young children. No history of blood clots. She has normal menstrual cycles. She currently has IUD in place.     Past Medical History:  Diagnosis Date  . Herpes   . Medical history non-contributory   . Premature ventricular beats     Past Surgical History:  Procedure Laterality Date  . APPENDECTOMY    . EYE SURGERY    . TONSILLECTOMY      Family History  Problem Relation Age of Onset  . Hypertension Mother   . Hypertension Maternal Grandmother   . Hypertension Maternal Grandfather     Social History   Socioeconomic History  . Marital status: Married    Spouse name: Not on file  . Number of children: 2  . Years of education: Not on file  . Highest education level: Not on file  Occupational History  . Not on file  Social Needs  . Financial resource strain: Not on file  . Food insecurity:    Worry: Not on file    Inability: Not on file  . Transportation needs:    Medical: Not on  file    Non-medical: Not on file  Tobacco Use  . Smoking status: Never Smoker  . Smokeless tobacco: Never Used  Substance and Sexual Activity  . Alcohol use: No  . Drug use: No  . Sexual activity: Yes  Lifestyle  . Physical activity:    Days per week: Not on file    Minutes per session: Not on file  . Stress: Not on file  Relationships  . Social connections:    Talks on phone: Not on file    Gets together: Not on file    Attends religious service: Not on file    Active member of club or organization: Not on file    Attends meetings of clubs or organizations: Not on file    Relationship status: Not on file  . Intimate partner violence:    Fear of current or ex partner: Not on file    Emotionally abused: Not on file    Physically abused: Not on file    Forced sexual activity: Not on file  Other Topics Concern  . Not on file  Social History Narrative  . Not on file    Current Outpatient Medications on File Prior to Visit  Medication Sig Dispense Refill  . Multiple Vitamins-Minerals (MULTIVITAL PO) Take 1 tablet by mouth daily.  No current facility-administered medications on file prior to visit.      Review of Systems  Constitution: Positive for malaise/fatigue (after exercise). Negative for decreased appetite, weight gain and weight loss.  Eyes: Negative for visual disturbance.  Cardiovascular: Positive for chest pain (occsional sharp, left-sided; not associated with exertion) and palpitations (occasional feeling of skipped beats). Negative for claudication, dyspnea on exertion and leg swelling.  Respiratory: Negative for hemoptysis, shortness of breath and wheezing.   Endocrine: Negative for cold intolerance and heat intolerance.  Skin: Negative for nail changes.  Musculoskeletal: Negative for myalgias.  Gastrointestinal: Negative for abdominal pain, nausea and vomiting.  Genitourinary: Negative for menorrhagia.  Neurological: Negative for difficulty with  concentration, dizziness, focal weakness and headaches.  Psychiatric/Behavioral: Negative for altered mental status and suicidal ideas.  All other systems reviewed and are negative.      Objective:    Today's Vitals   01/21/19 1310  BP: 123/87  Pulse: 85  SpO2: 98%  Weight: 168 lb (76.2 kg)  Height: 5\' 1"  (1.549 m)   Body mass index is 31.74 kg/m.   Echocardiogram 12/08/2018: Left ventricle cavity is normal in size. Normal left ventricular shape. Normal global wall motion. Diastolic function assessment limited due to frequent PAC's, but probably normal. Calculated EF 58%. Mild to moderate mitral regurgitation. IVC is dilated with respiratory variation. This may suggest elevated right heart pressure.    Physical Exam  Constitutional: She is oriented to person, place, and time. Vital signs are normal. She appears well-developed and well-nourished.  HENT:  Head: Normocephalic and atraumatic.  Neck: Normal range of motion.  Cardiovascular: Normal rate, normal heart sounds and intact distal pulses. An irregular rhythm present. Frequent extrasystoles are present.  Pulmonary/Chest: Effort normal and breath sounds normal. No accessory muscle usage. No respiratory distress.  Abdominal: Soft. Bowel sounds are normal.  Musculoskeletal: Normal range of motion.  Neurological: She is alert and oriented to person, place, and time.  Skin: Skin is warm and dry.  Vitals reviewed.      Assessment & Recommendations:  1. PAC (premature atrial contraction) Had 9.8% PAC burden on holter monitor. She is asymptomatic. Continues to use B6 and B12 vitamins and would recommend continuing the same. No etiology found for her palpitations.   2. PVC (premature ventricular contraction) Found to have 13.2% PVC burden on holter monitor. As stated above is mostly asymptomatic. No PVC induced cardiomyopathy noted on echo. In view of her young age and lack of symptoms, do not feel that she needs medical  therapy unless she develops symptoms. Encouraged her to continue with regular exercise.  3. Pleuritic chest pain She continues to have occasion pleuritic chest pain that has improved since initially seen by [place]. Symptoms are suggestive of muscle spasm or costochondritis. Encouraged her to continue with heat/ice and to use tylenol or ibuprofen as needed. Do not feel that she needs stress testing.     Altamese [place], FNP-C [facility] Office: [phone] Fax: [phone]

## 2019-01-21 ENCOUNTER — Ambulatory Visit: Payer: Medicaid Other | Admitting: Cardiology

## 2019-01-21 ENCOUNTER — Encounter: Payer: Self-pay | Admitting: Cardiology

## 2019-01-21 ENCOUNTER — Other Ambulatory Visit: Payer: Self-pay

## 2019-01-21 VITALS — BP 123/87 | HR 85 | Ht 61.0 in | Wt 168.0 lb

## 2019-01-21 DIAGNOSIS — R0781 Pleurodynia: Secondary | ICD-10-CM | POA: Diagnosis not present

## 2019-01-21 DIAGNOSIS — I493 Ventricular premature depolarization: Secondary | ICD-10-CM

## 2019-01-21 DIAGNOSIS — I491 Atrial premature depolarization: Secondary | ICD-10-CM | POA: Diagnosis not present

## 2019-09-12 ENCOUNTER — Other Ambulatory Visit: Payer: Self-pay

## 2019-09-12 DIAGNOSIS — Z20822 Contact with and (suspected) exposure to covid-19: Secondary | ICD-10-CM

## 2019-09-13 LAB — NOVEL CORONAVIRUS, NAA: SARS-CoV-2, NAA: NOT DETECTED

## 2020-11-14 ENCOUNTER — Telehealth: Payer: Self-pay

## 2021-01-07 ENCOUNTER — Telehealth: Payer: Self-pay

## 2021-01-07 DIAGNOSIS — I493 Ventricular premature depolarization: Secondary | ICD-10-CM

## 2021-01-07 DIAGNOSIS — I491 Atrial premature depolarization: Secondary | ICD-10-CM

## 2021-01-07 DIAGNOSIS — R0781 Pleurodynia: Secondary | ICD-10-CM

## 2021-01-07 NOTE — Telephone Encounter (Signed)
LMOM for pt to sch appts

## 2021-01-07 NOTE — Telephone Encounter (Signed)
-----   Message from Ronna Polio sent at 08/02/2019 12:49 PM EDT ----- Regarding: Schedule Echo & F/U Please call pt to schedule echo and follow-up appts.  Per Altamese Ursina, NP on [facility] 01/21/2019 Return in about 2 years (around 01/20/2021) for after echo for frequent PVC/PAC's.

## 2022-08-21 ENCOUNTER — Encounter: Payer: Self-pay | Admitting: Obstetrics and Gynecology

## 2022-08-21 ENCOUNTER — Other Ambulatory Visit (HOSPITAL_COMMUNITY)
Admission: RE | Admit: 2022-08-21 | Discharge: 2022-08-21 | Disposition: A | Discharge status: Home / Self Care | Payer: Medicaid Other | Source: Ambulatory Visit | Attending: Obstetrics and Gynecology | Admitting: Obstetrics and Gynecology

## 2022-08-21 ENCOUNTER — Ambulatory Visit (INDEPENDENT_AMBULATORY_CARE_PROVIDER_SITE_OTHER): Payer: Medicaid Other | Admitting: Obstetrics and Gynecology

## 2022-08-21 VITALS — BP 135/85 | HR 87 | Ht 61.0 in | Wt 163.3 lb

## 2022-08-21 DIAGNOSIS — Z01419 Encounter for gynecological examination (general) (routine) without abnormal findings: Secondary | ICD-10-CM | POA: Diagnosis present

## 2022-08-21 DIAGNOSIS — Z30431 Encounter for routine checking of intrauterine contraceptive device: Secondary | ICD-10-CM | POA: Diagnosis not present

## 2022-08-21 NOTE — Progress Notes (Signed)
Subjective:     Brianna Crosby is a 36 y.o. female P2 with BMI 30 and amenorrhea due to Mirena IUD who is here for a comprehensive physical exam. The patient reports no problems. She is sexually active without complaints. She reports mostly amenorrhea with IUD but may experience some occasional monthly bleeding. She denies pelvic pain or abnormal discharge. She denies any urinary symptoms or constipation. Patient is without complaints. MIrena IUD inserted in 2017  Past Medical History:  Diagnosis Date   Herpes    Medical history non-contributory    Premature ventricular beats    Past Surgical History:  Procedure Laterality Date   APPENDECTOMY     EYE SURGERY     TONSILLECTOMY     Family History  Problem Relation Age of Onset   Hypertension Mother    Hypertension Maternal Grandmother    Hypertension Maternal Grandfather     Social History   Socioeconomic History   Marital status: Married    Spouse name: Not on file   Number of children: 2   Years of education: Not on file   Highest education level: Not on file  Occupational History   Not on file  Tobacco Use   Smoking status: Never   Smokeless tobacco: Never  Vaping Use   Vaping Use: Never used  Substance and Sexual Activity   Alcohol use: Yes    Comment: occ   Drug use: No   Sexual activity: Yes    Birth control/protection: I.U.D.  Other Topics Concern   Not on file  Social History Narrative   Not on file   Social Determinants of Health   Financial Resource Strain: Not on file  Food Insecurity: Not on file  Transportation Needs: Not on file  Physical Activity: Not on file  Stress: Not on file  Social Connections: Not on file  Intimate Partner Violence: Not on file   Health Maintenance  Topic Date Due   COVID-19 Vaccine (1) Never done   Hepatitis C Screening  Never done   PAP SMEAR-Modifier  Never done   INFLUENZA VACCINE  Never done   TETANUS/TDAP  03/12/2026   HIV Screening  Completed   HPV  VACCINES  Aged Out       Review of Systems Pertinent items noted in HPI and remainder of comprehensive ROS otherwise negative.   Objective:  Blood pressure 135/85, pulse 87, height 5\' 1"  (1.549 m), weight 163 lb 4.8 oz (74.1 kg), unknown if currently breastfeeding.   GENERAL: Well-developed, well-nourished female in no acute distress.  HEENT: Normocephalic, atraumatic. Sclerae anicteric.  NECK: Supple. Normal thyroid.  LUNGS: Clear to auscultation bilaterally.  HEART: Regular rate and rhythm. BREASTS: Symmetric in size. No palpable masses or lymphadenopathy, skin changes, or nipple drainage. ABDOMEN: Soft, nontender, nondistended. No organomegaly. PELVIC: Normal external female genitalia. Vagina is pink and rugated.  Normal discharge. Normal appearing cervix with IUD strings visualized at the os. Uterus is normal in size. No adnexal mass or tenderness. Chaperone present during the pelvic exam EXTREMITIES: No cyanosis, clubbing, or edema, 2+ distal pulses.     Assessment:    Healthy female exam.      Plan:    Pap smear collected STI screening per patient request Patient reports health maintenance labs with PCP last month Patient will be contacted with abnormal results See After Visit Summary for Counseling Recommendations

## 2022-08-21 NOTE — Progress Notes (Signed)
NGYN pt presents for IUD removal and reinsertion. IUD placed 2017.   Pt states last PAP was last year. Pt requesting STD testing.

## 2022-08-22 LAB — CERVICOVAGINAL ANCILLARY ONLY
Chlamydia: NEGATIVE
Comment: NEGATIVE
Comment: NORMAL
Neisseria Gonorrhea: NEGATIVE

## 2022-08-23 LAB — RPR, QUANT+TP ABS (REFLEX)
Rapid Plasma Reagin, Quant: 1:1 {titer} — ABNORMAL HIGH
T Pallidum Abs: NONREACTIVE

## 2022-08-23 LAB — HEPATITIS C ANTIBODY: Hep C Virus Ab: NONREACTIVE

## 2022-08-23 LAB — RPR: RPR Ser Ql: REACTIVE — AB

## 2022-08-23 LAB — HEPATITIS B SURFACE ANTIGEN: Hepatitis B Surface Ag: NEGATIVE

## 2022-08-23 LAB — HIV ANTIBODY (ROUTINE TESTING W REFLEX): HIV Screen 4th Generation wRfx: NONREACTIVE

## 2022-08-28 LAB — CYTOLOGY - PAP
Comment: NEGATIVE
Diagnosis: NEGATIVE
High risk HPV: NEGATIVE

## 2023-02-25 ENCOUNTER — Other Ambulatory Visit (HOSPITAL_COMMUNITY)
Admission: RE | Admit: 2023-02-25 | Discharge: 2023-02-25 | Disposition: A | Discharge status: Home / Self Care | Payer: 59 | Source: Ambulatory Visit | Attending: Obstetrics & Gynecology | Admitting: Obstetrics & Gynecology

## 2023-02-25 ENCOUNTER — Ambulatory Visit (INDEPENDENT_AMBULATORY_CARE_PROVIDER_SITE_OTHER): Payer: 59 | Admitting: *Deleted

## 2023-02-25 VITALS — BP 140/79 | HR 44 | Ht 61.0 in | Wt 161.0 lb

## 2023-02-25 DIAGNOSIS — R399 Unspecified symptoms and signs involving the genitourinary system: Secondary | ICD-10-CM

## 2023-02-25 DIAGNOSIS — N898 Other specified noninflammatory disorders of vagina: Secondary | ICD-10-CM | POA: Insufficient documentation

## 2023-02-25 DIAGNOSIS — Z113 Encounter for screening for infections with a predominantly sexual mode of transmission: Secondary | ICD-10-CM | POA: Insufficient documentation

## 2023-02-25 LAB — POCT URINALYSIS DIPSTICK
Bilirubin, UA: NEGATIVE
Glucose, UA: NEGATIVE
Ketones, UA: NEGATIVE
Nitrite, UA: NEGATIVE
Protein, UA: NEGATIVE
Spec Grav, UA: 1.015 (ref 1.010–1.025)
Urobilinogen, UA: 0.2 E.U./dL
pH, UA: 6 (ref 5.0–8.0)

## 2023-02-25 MED ORDER — NITROFURANTOIN MONOHYD MACRO 100 MG PO CAPS: 100.0000 mg | ORAL_CAPSULE | Freq: Two times a day (BID) | ORAL | 0 refills | Status: AC

## 2023-02-25 NOTE — Progress Notes (Signed)
Pt not seen by provider, worked up as nurse visit.   Pt having symptoms of uti since Sunday - complains of burning with urination. Pt states she has also been having some vaginal discharge with slight odor, ?yeast. Pt request full swab today, with std screening.

## 2023-02-26 LAB — CERVICOVAGINAL ANCILLARY ONLY
Bacterial Vaginitis (gardnerella): NEGATIVE
Candida Glabrata: NEGATIVE
Candida Vaginitis: POSITIVE — AB
Chlamydia: NEGATIVE
Comment: NEGATIVE
Comment: NEGATIVE
Comment: NEGATIVE
Comment: NEGATIVE
Comment: NEGATIVE
Comment: NORMAL
Neisseria Gonorrhea: NEGATIVE
Trichomonas: NEGATIVE

## 2023-02-27 LAB — URINE CULTURE

## 2023-03-14 ENCOUNTER — Other Ambulatory Visit: Payer: Self-pay | Admitting: Obstetrics & Gynecology

## 2023-03-14 DIAGNOSIS — N898 Other specified noninflammatory disorders of vagina: Secondary | ICD-10-CM

## 2023-03-14 MED ORDER — FLUCONAZOLE 150 MG PO TABS: 150.0000 mg | ORAL_TABLET | Freq: Once | ORAL | 0 refills | Status: AC

## 2023-03-14 NOTE — Progress Notes (Signed)
Meds ordered this encounter  Medications   fluconazole (DIFLUCAN) 150 MG tablet    Sig: Take 1 tablet (150 mg total) by mouth once for 1 dose.    Dispense:  1 tablet    Refill:  0    

## 2023-06-24 NOTE — Telephone Encounter (Signed)
done

## 2024-07-04 ENCOUNTER — Ambulatory Visit: Admitting: Obstetrics and Gynecology

## 2024-07-25 ENCOUNTER — Ambulatory Visit (INDEPENDENT_AMBULATORY_CARE_PROVIDER_SITE_OTHER): Admitting: Obstetrics and Gynecology

## 2024-07-25 ENCOUNTER — Other Ambulatory Visit (HOSPITAL_COMMUNITY)
Admission: RE | Admit: 2024-07-25 | Discharge: 2024-07-25 | Disposition: A | Discharge status: Home / Self Care | Source: Ambulatory Visit | Attending: Advanced Practice Midwife | Admitting: Advanced Practice Midwife

## 2024-07-25 ENCOUNTER — Encounter: Payer: Self-pay | Admitting: Obstetrics and Gynecology

## 2024-07-25 VITALS — BP 133/83 | HR 98 | Ht 62.0 in | Wt 166.0 lb

## 2024-07-25 DIAGNOSIS — Z01419 Encounter for gynecological examination (general) (routine) without abnormal findings: Secondary | ICD-10-CM | POA: Diagnosis present

## 2024-07-25 DIAGNOSIS — Z30433 Encounter for removal and reinsertion of intrauterine contraceptive device: Secondary | ICD-10-CM | POA: Diagnosis not present

## 2024-07-25 MED ORDER — VALACYCLOVIR HCL 500 MG PO TABS: 500.0000 mg | ORAL_TABLET | Freq: Two times a day (BID) | ORAL | 6 refills | Status: AC

## 2024-07-25 MED ORDER — LEVONORGESTREL 20 MCG/DAY IU IUD
1.0000 | INTRAUTERINE_SYSTEM | Freq: Once | INTRAUTERINE | Status: AC
  Administered 2024-07-25: 1 via INTRAUTERINE

## 2024-07-25 NOTE — Progress Notes (Addendum)
 38 y.o. GYN presents for AEX/PAP/STD screening.  Pt uses Mirena  IUD for [facility].  Administrations This Visit     levonorgestrel  (MIRENA ) 20 MCG/DAY IUD 1 each     Admin Date 07/25/2024 Action Given Dose 1 each Route Intrauterine Documented By Burnard Niels PARAS, RMA

## 2024-07-25 NOTE — Progress Notes (Signed)
 Subjective:     Brianna Crosby is a 38 y.o. female P2 with amenorrhea related to Mirena  IUD and BMI 30 who is here for a comprehensive physical exam. The patient reports no problems. She is sexually active using Mirena  IUD for contraception. Her IUD has expired and patient desires a replacement. She denies pelvic pain or abnormal discharge. She denies urinary incontinence or issues with bowel movements  Past Medical History:  Diagnosis Date   Herpes    Medical history non-contributory    Premature ventricular beats    Past Surgical History:  Procedure Laterality Date   APPENDECTOMY     EYE SURGERY     TONSILLECTOMY     Family History  Problem Relation Age of Onset   Hypertension Mother    Hypertension Maternal Grandmother    Hypertension Maternal Grandfather     Social History   Socioeconomic History   Marital status: Married    Spouse name: Not on file   Number of children: 2   Years of education: Not on file   Highest education level: Not on file  Occupational History   Not on file  Tobacco Use   Smoking status: Never   Smokeless tobacco: Never  Vaping Use   Vaping status: Never Used  Substance and Sexual Activity   Alcohol use: Yes    Comment: occ   Drug use: No   Sexual activity: Yes    Birth control/protection: I.U.D.  Other Topics Concern   Not on file  Social History Narrative   Not on file   Social Drivers of Health   Financial Resource Strain: Not on file  Food Insecurity: Not on file  Transportation Needs: Not on file  Physical Activity: Not on file  Stress: Not on file  Social Connections: Not on file  Intimate Partner Violence: Not on file   Health Maintenance  Topic Date Due   DTaP/Tdap/Td (1 - Tdap) Never done   Hepatitis B Vaccines 19-59 Average Risk (1 of 3 - 19+ 3-dose series) Never done   HPV VACCINES (1 - 3-dose SCDM series) Never done   Influenza Vaccine  Never done   COVID-19 Vaccine (3 - 2025-26 season) 07/11/2024   Cervical  Cancer Screening (HPV/Pap Cotest)  08/22/2027   Hepatitis C Screening  Completed   HIV Screening  Completed   Pneumococcal Vaccine  Aged Out   Meningococcal B Vaccine  Aged Out       Review of Systems Pertinent items noted in HPI and remainder of comprehensive ROS otherwise negative.   Objective:  Blood pressure (!) 146/98, pulse 89, height 5' 2 (1.575 m), weight 166 lb (75.3 kg), unknown if currently breastfeeding.   GENERAL: Well-developed, well-nourished female in no acute distress.  HEENT: Normocephalic, atraumatic. Sclerae anicteric.  NECK: Supple. Normal thyroid .  LUNGS: Clear to auscultation bilaterally.  HEART: Regular rate and rhythm. BREASTS: Symmetric in size. No palpable masses or lymphadenopathy, skin changes, or nipple drainage. ABDOMEN: Soft, nontender, nondistended. No organomegaly. PELVIC: Normal external female genitalia. Vagina is pink and rugated.  Normal discharge. Normal appearing cervix. Uterus is normal in size. No adnexal mass or tenderness. Chaperone present during the pelvic exam EXTREMITIES: No cyanosis, clubbing, or edema, 2+ distal pulses.     Assessment:    Healthy female exam.      Plan:    Pap smear collected STI screening collected per patient request Patient will be contacted with abnormal results  GYNECOLOGY CLINIC PROCEDURE NOTE  IUD Removal  Patient identified, informed consent performed, consent signed.  Patient was in the dorsal lithotomy position, normal external genitalia was noted.  A speculum was placed in the patient's vagina, normal discharge was noted, no lesions. The cervix was visualized, no lesions, no abnormal discharge.  The strings of the IUD were grasped and pulled using ring forceps. The IUD was removed in its entirety. Patient tolerated the procedure well.    IUD Procedure Note Cervix  was cleaned with Betadine x 2.  Grasped anteriorly with a single tooth tenaculum.  Uterus sounded to 8 cm.  Mirena  IUD placed per  manufacturer's recommendations.  Strings trimmed to 3 cm. Tenaculum was removed, good hemostasis noted.  Patient tolerated procedure well.   Patient given post procedure instructions and Mirena  care card with expiration date.  Patient is asked to check IUD strings periodically and follow up in 4-6 weeks for IUD check.     See After Visit Summary for Counseling Recommendations

## 2024-07-25 NOTE — Addendum Note (Signed)
 Addended by: Dreya Buhrman J on: 07/25/2024 02:09 PM   Modules accepted: Orders

## 2024-07-26 LAB — CYTOLOGY - PAP
Adequacy: ABSENT
Comment: NEGATIVE
Diagnosis: NEGATIVE
High risk HPV: NEGATIVE

## 2024-07-26 LAB — RPR: RPR Ser Ql: NONREACTIVE

## 2024-07-26 LAB — HEPATITIS B SURFACE ANTIGEN: Hepatitis B Surface Ag: NEGATIVE

## 2024-07-26 LAB — CERVICOVAGINAL ANCILLARY ONLY
Chlamydia: NEGATIVE
Comment: NEGATIVE
Comment: NORMAL
Neisseria Gonorrhea: NEGATIVE

## 2024-07-26 LAB — HEPATITIS C ANTIBODY: Hep C Virus Ab: NONREACTIVE

## 2024-07-26 LAB — HIV ANTIBODY (ROUTINE TESTING W REFLEX): HIV Screen 4th Generation wRfx: NONREACTIVE

## 2024-08-25 ENCOUNTER — Ambulatory Visit: Admitting: Obstetrics and Gynecology
